# Patient Record
Sex: Female | Born: 1970 | Race: White | Hispanic: No | Marital: Married | Smoking: Former smoker
Health system: Southern US, Community
[De-identification: ages and names within clinical notes are randomized; demographics above are authoritative.]

## PROBLEM LIST (undated history)

## (undated) DIAGNOSIS — M7580 Other shoulder lesions, unspecified shoulder: Secondary | ICD-10-CM

## (undated) DIAGNOSIS — I8390 Asymptomatic varicose veins of unspecified lower extremity: Secondary | ICD-10-CM

## (undated) DIAGNOSIS — Z87442 Personal history of urinary calculi: Secondary | ICD-10-CM

## (undated) DIAGNOSIS — N200 Calculus of kidney: Secondary | ICD-10-CM

## (undated) DIAGNOSIS — M069 Rheumatoid arthritis, unspecified: Secondary | ICD-10-CM

## (undated) DIAGNOSIS — M3500 Sicca syndrome, unspecified: Secondary | ICD-10-CM

## (undated) DIAGNOSIS — M1990 Unspecified osteoarthritis, unspecified site: Secondary | ICD-10-CM

## (undated) DIAGNOSIS — N83209 Unspecified ovarian cyst, unspecified side: Secondary | ICD-10-CM

## (undated) DIAGNOSIS — M797 Fibromyalgia: Secondary | ICD-10-CM

## (undated) DIAGNOSIS — Z8719 Personal history of other diseases of the digestive system: Secondary | ICD-10-CM

## (undated) DIAGNOSIS — I7300 Raynaud's syndrome without gangrene: Secondary | ICD-10-CM

## (undated) DIAGNOSIS — K589 Irritable bowel syndrome without diarrhea: Secondary | ICD-10-CM

## (undated) HISTORY — PX: LITHOTRIPSY: SUR834

## (undated) HISTORY — PX: CHOLECYSTECTOMY: SHX55

## (undated) HISTORY — PX: ENDOMETRIAL ABLATION: SHX621

## (undated) HISTORY — PX: OTHER SURGICAL HISTORY: SHX169

---

## 1998-01-18 ENCOUNTER — Ambulatory Visit (HOSPITAL_COMMUNITY): Admission: RE | Admit: 1998-01-18 | Discharge: 1998-01-18 | Payer: Self-pay | Admitting: Obstetrics and Gynecology

## 1998-02-09 ENCOUNTER — Inpatient Hospital Stay (HOSPITAL_COMMUNITY): Admission: AD | Admit: 1998-02-09 | Discharge: 1998-02-12 | Payer: Self-pay | Admitting: Obstetrics and Gynecology

## 1998-03-31 ENCOUNTER — Other Ambulatory Visit: Admission: RE | Admit: 1998-03-31 | Discharge: 1998-03-31 | Payer: Self-pay | Admitting: Obstetrics and Gynecology

## 1999-09-09 ENCOUNTER — Emergency Department (HOSPITAL_COMMUNITY): Admission: EM | Admit: 1999-09-09 | Discharge: 1999-09-09 | Payer: Self-pay | Admitting: Emergency Medicine

## 2001-04-24 ENCOUNTER — Emergency Department (HOSPITAL_COMMUNITY): Admission: EM | Admit: 2001-04-24 | Discharge: 2001-04-24 | Payer: Self-pay | Admitting: Emergency Medicine

## 2001-04-24 ENCOUNTER — Encounter: Payer: Self-pay | Admitting: Emergency Medicine

## 2001-05-08 ENCOUNTER — Encounter: Payer: Self-pay | Admitting: Urology

## 2001-05-08 ENCOUNTER — Ambulatory Visit (HOSPITAL_COMMUNITY): Admission: RE | Admit: 2001-05-08 | Discharge: 2001-05-08 | Payer: Self-pay | Admitting: Urology

## 2001-05-15 ENCOUNTER — Emergency Department (HOSPITAL_COMMUNITY): Admission: EM | Admit: 2001-05-15 | Discharge: 2001-05-15 | Payer: Self-pay | Admitting: *Deleted

## 2001-05-20 ENCOUNTER — Ambulatory Visit (HOSPITAL_COMMUNITY): Admission: RE | Admit: 2001-05-20 | Discharge: 2001-05-20 | Payer: Self-pay | Admitting: *Deleted

## 2001-05-21 ENCOUNTER — Ambulatory Visit (HOSPITAL_COMMUNITY): Admission: RE | Admit: 2001-05-21 | Discharge: 2001-05-21 | Payer: Self-pay | Admitting: *Deleted

## 2002-03-23 ENCOUNTER — Emergency Department (HOSPITAL_COMMUNITY): Admission: EM | Admit: 2002-03-23 | Discharge: 2002-03-23 | Payer: Self-pay | Admitting: Emergency Medicine

## 2002-05-12 ENCOUNTER — Emergency Department (HOSPITAL_COMMUNITY): Admission: EM | Admit: 2002-05-12 | Discharge: 2002-05-12 | Payer: Self-pay | Admitting: Emergency Medicine

## 2002-05-31 ENCOUNTER — Emergency Department (HOSPITAL_COMMUNITY): Admission: EM | Admit: 2002-05-31 | Discharge: 2002-05-31 | Payer: Self-pay | Admitting: Emergency Medicine

## 2002-06-30 ENCOUNTER — Emergency Department (HOSPITAL_COMMUNITY): Admission: EM | Admit: 2002-06-30 | Discharge: 2002-06-30 | Payer: Self-pay | Admitting: *Deleted

## 2002-07-22 ENCOUNTER — Emergency Department (HOSPITAL_COMMUNITY): Admission: EM | Admit: 2002-07-22 | Discharge: 2002-07-22 | Payer: Self-pay | Admitting: Emergency Medicine

## 2002-08-02 ENCOUNTER — Encounter: Payer: Self-pay | Admitting: Emergency Medicine

## 2002-08-02 ENCOUNTER — Emergency Department (HOSPITAL_COMMUNITY): Admission: EM | Admit: 2002-08-02 | Discharge: 2002-08-02 | Payer: Self-pay | Admitting: Emergency Medicine

## 2003-04-09 ENCOUNTER — Encounter: Payer: Self-pay | Admitting: Emergency Medicine

## 2003-04-09 ENCOUNTER — Emergency Department (HOSPITAL_COMMUNITY): Admission: EM | Admit: 2003-04-09 | Discharge: 2003-04-10 | Payer: Self-pay | Admitting: Emergency Medicine

## 2003-07-31 LAB — CONVERTED CEMR LAB: Pap Smear: NORMAL

## 2003-08-19 ENCOUNTER — Emergency Department (HOSPITAL_COMMUNITY): Admission: EM | Admit: 2003-08-19 | Discharge: 2003-08-19 | Payer: Self-pay | Admitting: Emergency Medicine

## 2003-09-05 ENCOUNTER — Emergency Department (HOSPITAL_COMMUNITY): Admission: AD | Admit: 2003-09-05 | Discharge: 2003-09-05 | Payer: Self-pay | Admitting: Family Medicine

## 2003-11-01 ENCOUNTER — Emergency Department (HOSPITAL_COMMUNITY): Admission: AD | Admit: 2003-11-01 | Discharge: 2003-11-01 | Payer: Self-pay | Admitting: Family Medicine

## 2004-01-25 ENCOUNTER — Emergency Department (HOSPITAL_COMMUNITY): Admission: EM | Admit: 2004-01-25 | Discharge: 2004-01-25 | Payer: Self-pay | Admitting: Emergency Medicine

## 2004-02-05 ENCOUNTER — Observation Stay (HOSPITAL_COMMUNITY): Admission: RE | Admit: 2004-02-05 | Discharge: 2004-02-06 | Payer: Self-pay

## 2004-02-06 ENCOUNTER — Encounter (INDEPENDENT_AMBULATORY_CARE_PROVIDER_SITE_OTHER): Payer: Self-pay | Admitting: Specialist

## 2004-12-07 ENCOUNTER — Emergency Department (HOSPITAL_COMMUNITY): Admission: EM | Admit: 2004-12-07 | Discharge: 2004-12-07 | Payer: Self-pay | Admitting: Emergency Medicine

## 2005-04-03 ENCOUNTER — Emergency Department (HOSPITAL_COMMUNITY): Admission: EM | Admit: 2005-04-03 | Discharge: 2005-04-03 | Payer: Self-pay | Admitting: Family Medicine

## 2005-04-20 ENCOUNTER — Encounter
Admission: RE | Admit: 2005-04-20 | Discharge: 2005-07-19 | Payer: Self-pay | Admitting: Physical Medicine & Rehabilitation

## 2005-08-06 ENCOUNTER — Emergency Department (HOSPITAL_COMMUNITY): Admission: EM | Admit: 2005-08-06 | Discharge: 2005-08-07 | Payer: Self-pay | Admitting: Emergency Medicine

## 2005-09-01 ENCOUNTER — Emergency Department (HOSPITAL_COMMUNITY): Admission: EM | Admit: 2005-09-01 | Discharge: 2005-09-01 | Payer: Self-pay | Admitting: Emergency Medicine

## 2005-09-07 ENCOUNTER — Ambulatory Visit: Payer: Self-pay | Admitting: Family Medicine

## 2005-09-07 ENCOUNTER — Other Ambulatory Visit: Admission: RE | Admit: 2005-09-07 | Discharge: 2005-09-07 | Payer: Self-pay | Admitting: Obstetrics and Gynecology

## 2005-09-13 ENCOUNTER — Ambulatory Visit: Payer: Self-pay | Admitting: Family Medicine

## 2005-09-24 ENCOUNTER — Encounter (INDEPENDENT_AMBULATORY_CARE_PROVIDER_SITE_OTHER): Payer: Self-pay | Admitting: Specialist

## 2005-09-24 ENCOUNTER — Ambulatory Visit (HOSPITAL_COMMUNITY): Admission: RE | Admit: 2005-09-24 | Discharge: 2005-09-24 | Payer: Self-pay | Admitting: Obstetrics and Gynecology

## 2005-10-04 ENCOUNTER — Emergency Department (HOSPITAL_COMMUNITY): Admission: EM | Admit: 2005-10-04 | Discharge: 2005-10-04 | Payer: Self-pay | Admitting: Family Medicine

## 2005-11-27 ENCOUNTER — Emergency Department (HOSPITAL_COMMUNITY): Admission: EM | Admit: 2005-11-27 | Discharge: 2005-11-27 | Payer: Self-pay | Admitting: Family Medicine

## 2005-12-20 ENCOUNTER — Emergency Department (HOSPITAL_COMMUNITY): Admission: EM | Admit: 2005-12-20 | Discharge: 2005-12-20 | Payer: Self-pay | Admitting: Emergency Medicine

## 2005-12-30 ENCOUNTER — Emergency Department (HOSPITAL_COMMUNITY): Admission: EM | Admit: 2005-12-30 | Discharge: 2005-12-30 | Payer: Self-pay | Admitting: Emergency Medicine

## 2006-05-15 ENCOUNTER — Emergency Department (HOSPITAL_COMMUNITY): Admission: EM | Admit: 2006-05-15 | Discharge: 2006-05-15 | Payer: Self-pay | Admitting: Family Medicine

## 2006-06-18 ENCOUNTER — Emergency Department (HOSPITAL_COMMUNITY): Admission: EM | Admit: 2006-06-18 | Discharge: 2006-06-18 | Payer: Self-pay | Admitting: Emergency Medicine

## 2006-10-16 ENCOUNTER — Emergency Department (HOSPITAL_COMMUNITY): Admission: EM | Admit: 2006-10-16 | Discharge: 2006-10-16 | Payer: Self-pay | Admitting: Family Medicine

## 2007-01-03 ENCOUNTER — Emergency Department (HOSPITAL_COMMUNITY): Admission: EM | Admit: 2007-01-03 | Discharge: 2007-01-03 | Payer: Self-pay | Admitting: Emergency Medicine

## 2007-02-06 ENCOUNTER — Emergency Department (HOSPITAL_COMMUNITY): Admission: EM | Admit: 2007-02-06 | Discharge: 2007-02-06 | Payer: Self-pay | Admitting: Emergency Medicine

## 2007-04-10 DIAGNOSIS — I10 Essential (primary) hypertension: Secondary | ICD-10-CM | POA: Insufficient documentation

## 2007-04-10 DIAGNOSIS — R51 Headache: Secondary | ICD-10-CM

## 2007-04-10 DIAGNOSIS — R519 Headache, unspecified: Secondary | ICD-10-CM | POA: Insufficient documentation

## 2007-11-06 ENCOUNTER — Emergency Department (HOSPITAL_COMMUNITY): Admission: EM | Admit: 2007-11-06 | Discharge: 2007-11-06 | Payer: Self-pay | Admitting: Emergency Medicine

## 2008-04-15 ENCOUNTER — Emergency Department (HOSPITAL_BASED_OUTPATIENT_CLINIC_OR_DEPARTMENT_OTHER): Admission: EM | Admit: 2008-04-15 | Discharge: 2008-04-15 | Payer: Self-pay | Admitting: Emergency Medicine

## 2008-05-19 ENCOUNTER — Encounter: Admission: RE | Admit: 2008-05-19 | Discharge: 2008-05-19 | Payer: Self-pay | Admitting: Family Medicine

## 2008-12-21 ENCOUNTER — Ambulatory Visit: Payer: Self-pay | Admitting: Internal Medicine

## 2008-12-21 DIAGNOSIS — R69 Illness, unspecified: Secondary | ICD-10-CM

## 2008-12-21 DIAGNOSIS — G43009 Migraine without aura, not intractable, without status migrainosus: Secondary | ICD-10-CM | POA: Insufficient documentation

## 2008-12-21 DIAGNOSIS — F3289 Other specified depressive episodes: Secondary | ICD-10-CM | POA: Insufficient documentation

## 2008-12-21 DIAGNOSIS — K589 Irritable bowel syndrome without diarrhea: Secondary | ICD-10-CM

## 2008-12-21 DIAGNOSIS — F411 Generalized anxiety disorder: Secondary | ICD-10-CM | POA: Insufficient documentation

## 2008-12-21 DIAGNOSIS — Z87442 Personal history of urinary calculi: Secondary | ICD-10-CM

## 2008-12-21 DIAGNOSIS — F329 Major depressive disorder, single episode, unspecified: Secondary | ICD-10-CM

## 2008-12-21 DIAGNOSIS — M5090 Cervical disc disorder, unspecified, unspecified cervical region: Secondary | ICD-10-CM | POA: Insufficient documentation

## 2009-06-16 ENCOUNTER — Ambulatory Visit: Payer: Self-pay | Admitting: Internal Medicine

## 2009-06-16 DIAGNOSIS — G894 Chronic pain syndrome: Secondary | ICD-10-CM | POA: Insufficient documentation

## 2009-06-16 DIAGNOSIS — M25519 Pain in unspecified shoulder: Secondary | ICD-10-CM

## 2009-06-16 DIAGNOSIS — M549 Dorsalgia, unspecified: Secondary | ICD-10-CM | POA: Insufficient documentation

## 2009-06-21 ENCOUNTER — Encounter (INDEPENDENT_AMBULATORY_CARE_PROVIDER_SITE_OTHER): Payer: Self-pay | Admitting: *Deleted

## 2009-06-22 ENCOUNTER — Ambulatory Visit: Payer: Self-pay | Admitting: Internal Medicine

## 2009-07-09 ENCOUNTER — Encounter: Admission: RE | Admit: 2009-07-09 | Discharge: 2009-07-09 | Payer: Self-pay | Admitting: Internal Medicine

## 2009-07-18 ENCOUNTER — Ambulatory Visit: Payer: Self-pay | Admitting: Internal Medicine

## 2009-07-18 DIAGNOSIS — G4700 Insomnia, unspecified: Secondary | ICD-10-CM | POA: Insufficient documentation

## 2009-07-21 ENCOUNTER — Encounter: Admission: RE | Admit: 2009-07-21 | Discharge: 2009-07-21 | Payer: Self-pay | Admitting: Neurosurgery

## 2009-08-01 ENCOUNTER — Encounter
Admission: RE | Admit: 2009-08-01 | Discharge: 2009-10-30 | Payer: Self-pay | Admitting: Physical Medicine & Rehabilitation

## 2009-08-02 ENCOUNTER — Ambulatory Visit: Payer: Self-pay | Admitting: Physical Medicine & Rehabilitation

## 2009-08-02 ENCOUNTER — Telehealth: Payer: Self-pay | Admitting: Internal Medicine

## 2009-08-17 ENCOUNTER — Ambulatory Visit: Payer: Self-pay | Admitting: Internal Medicine

## 2009-09-05 ENCOUNTER — Ambulatory Visit: Payer: Self-pay | Admitting: Physical Medicine & Rehabilitation

## 2009-09-15 ENCOUNTER — Encounter: Payer: Self-pay | Admitting: Internal Medicine

## 2009-09-26 ENCOUNTER — Ambulatory Visit: Payer: Self-pay | Admitting: Internal Medicine

## 2009-10-06 ENCOUNTER — Ambulatory Visit: Payer: Self-pay | Admitting: Physical Medicine & Rehabilitation

## 2009-10-17 ENCOUNTER — Telehealth: Payer: Self-pay | Admitting: Internal Medicine

## 2009-10-18 ENCOUNTER — Encounter: Payer: Self-pay | Admitting: Internal Medicine

## 2009-10-28 ENCOUNTER — Ambulatory Visit: Payer: Self-pay | Admitting: Internal Medicine

## 2009-10-28 DIAGNOSIS — G2581 Restless legs syndrome: Secondary | ICD-10-CM | POA: Insufficient documentation

## 2009-11-01 ENCOUNTER — Encounter
Admission: RE | Admit: 2009-11-01 | Discharge: 2010-01-30 | Payer: Self-pay | Admitting: Physical Medicine & Rehabilitation

## 2009-11-07 ENCOUNTER — Ambulatory Visit: Payer: Self-pay | Admitting: Physical Medicine & Rehabilitation

## 2009-11-07 ENCOUNTER — Encounter: Payer: Self-pay | Admitting: Internal Medicine

## 2009-11-22 ENCOUNTER — Ambulatory Visit: Payer: Self-pay | Admitting: Internal Medicine

## 2009-12-15 ENCOUNTER — Telehealth: Payer: Self-pay | Admitting: Internal Medicine

## 2009-12-15 ENCOUNTER — Encounter: Payer: Self-pay | Admitting: Internal Medicine

## 2009-12-15 ENCOUNTER — Ambulatory Visit: Payer: Self-pay | Admitting: Physical Medicine & Rehabilitation

## 2010-01-12 ENCOUNTER — Ambulatory Visit: Payer: Self-pay | Admitting: Internal Medicine

## 2010-01-12 DIAGNOSIS — IMO0001 Reserved for inherently not codable concepts without codable children: Secondary | ICD-10-CM

## 2010-01-12 DIAGNOSIS — IMO0002 Reserved for concepts with insufficient information to code with codable children: Secondary | ICD-10-CM

## 2010-01-12 DIAGNOSIS — R300 Dysuria: Secondary | ICD-10-CM

## 2010-01-12 DIAGNOSIS — R799 Abnormal finding of blood chemistry, unspecified: Secondary | ICD-10-CM

## 2010-01-12 LAB — CONVERTED CEMR LAB
ANA Titer 1: NEGATIVE
Ketones, ur: NEGATIVE mg/dL
Nitrite: NEGATIVE
Specific Gravity, Urine: 1.015 (ref 1.000–1.030)
Urine Glucose: NEGATIVE mg/dL
Urobilinogen, UA: 0.2 (ref 0.0–1.0)
pH: 5.5 (ref 5.0–8.0)

## 2010-01-13 ENCOUNTER — Encounter: Payer: Self-pay | Admitting: Internal Medicine

## 2010-01-13 LAB — CONVERTED CEMR LAB
ALT: 11 units/L (ref 0–35)
Alkaline Phosphatase: 40 units/L (ref 39–117)
BUN: 11 mg/dL (ref 6–23)
Basophils Absolute: 0 10*3/uL (ref 0.0–0.1)
Bilirubin, Direct: 0.1 mg/dL (ref 0.0–0.3)
Cholesterol: 171 mg/dL (ref 0–200)
Creatinine, Ser: 0.5 mg/dL (ref 0.4–1.2)
HDL: 55.5 mg/dL (ref >39.00)
LDL Cholesterol: 87 mg/dL (ref 0–99)
MCHC: 34.7 g/dL (ref 30.0–36.0)
MCV: 96.2 fL (ref 78.0–100.0)
Monocytes Relative: 4.2 % (ref 3.0–12.0)
RDW: 12.4 % (ref 11.5–14.6)
Sed Rate: 4 mm/hr (ref 0–22)
Sodium: 142 meq/L (ref 135–145)
Total Protein: 6.7 g/dL (ref 6.0–8.3)
WBC: 5.6 10*3/uL (ref 4.5–10.5)

## 2010-01-16 ENCOUNTER — Encounter: Payer: Self-pay | Admitting: Internal Medicine

## 2010-01-16 ENCOUNTER — Ambulatory Visit: Payer: Self-pay | Admitting: Physical Medicine & Rehabilitation

## 2010-01-18 ENCOUNTER — Encounter: Payer: Self-pay | Admitting: Internal Medicine

## 2010-01-18 ENCOUNTER — Ambulatory Visit: Payer: Self-pay | Admitting: Pain Medicine

## 2010-01-18 ENCOUNTER — Encounter: Admission: RE | Admit: 2010-01-18 | Discharge: 2010-01-18 | Payer: Self-pay | Admitting: Family Medicine

## 2010-01-19 ENCOUNTER — Ambulatory Visit (HOSPITAL_COMMUNITY): Admission: RE | Admit: 2010-01-19 | Discharge: 2010-01-19 | Payer: Self-pay | Admitting: Internal Medicine

## 2010-02-08 ENCOUNTER — Encounter: Payer: Self-pay | Admitting: Internal Medicine

## 2010-02-08 ENCOUNTER — Telehealth: Payer: Self-pay | Admitting: Internal Medicine

## 2010-02-14 ENCOUNTER — Encounter
Admission: RE | Admit: 2010-02-14 | Discharge: 2010-02-14 | Payer: Self-pay | Admitting: Physical Medicine & Rehabilitation

## 2010-02-21 ENCOUNTER — Telehealth (INDEPENDENT_AMBULATORY_CARE_PROVIDER_SITE_OTHER): Payer: Self-pay | Admitting: *Deleted

## 2010-03-06 ENCOUNTER — Encounter: Payer: Self-pay | Admitting: Internal Medicine

## 2010-03-06 ENCOUNTER — Telehealth: Payer: Self-pay | Admitting: Internal Medicine

## 2010-04-11 ENCOUNTER — Ambulatory Visit: Payer: Self-pay | Admitting: Internal Medicine

## 2010-04-12 ENCOUNTER — Ambulatory Visit: Payer: Self-pay | Admitting: Internal Medicine

## 2010-04-29 ENCOUNTER — Encounter: Payer: Self-pay | Admitting: Internal Medicine

## 2010-05-01 ENCOUNTER — Ambulatory Visit: Payer: Self-pay | Admitting: Pain Medicine

## 2010-05-17 ENCOUNTER — Telehealth: Payer: Self-pay | Admitting: Internal Medicine

## 2010-05-30 ENCOUNTER — Encounter: Payer: Self-pay | Admitting: Internal Medicine

## 2010-08-19 ENCOUNTER — Encounter: Payer: Self-pay | Admitting: Family Medicine

## 2010-08-20 ENCOUNTER — Encounter: Payer: Self-pay | Admitting: Neurosurgery

## 2010-08-20 ENCOUNTER — Encounter: Payer: Self-pay | Admitting: Internal Medicine

## 2010-08-29 NOTE — Op Note (Signed)
Summary: Claudette Laws MD  Claudette Laws MD   Imported By: Sherian Rein 06/01/2010 13:18:52  _____________________________________________________________________  External Attachment:    Type:   Image     Comment:   External Document

## 2010-08-29 NOTE — Consult Note (Signed)
Summary: Stacey Drain MD  Stacey Drain MD   Imported By: Sherian Rein 02/20/2010 11:38:36  _____________________________________________________________________  External Attachment:    Type:   Image     Comment:   External Document

## 2010-08-29 NOTE — Consult Note (Signed)
Summary: [facility]  [facility]   Imported By: Sherian Rein 09/22/2009 09:54:48  _____________________________________________________________________  External Attachment:    Type:   Image     Comment:   External Document

## 2010-08-29 NOTE — Assessment & Plan Note (Signed)
Summary: 1 MTH FU   STC   Vital Signs:  Patient profile:   40 year old female Height:      63 inches (160.02 cm) Weight:      133.13 pounds (60.51 kg) O2 Sat:      98 % on Room air Temp:     97.4 degrees F (36.33 degrees C) oral Pulse rate:   81 / minute BP sitting:   148 / 94  (left arm) Cuff size:   regular  Vitals Entered By: Josph Macho CMA (August 17, 2009 4:17 PM)  O2 Flow:  Room air CC: 1 month follow up/ wants referral to pain management- Dr Oneal Grout pt needs refill of oxycodone/ CF   CC:  1 month follow up/ wants referral to pain management- Dr Oneal Grout pt needs refill of oxycodone/ CF.  History of Present Illness: saw dr Wynn Banker once and received steroid injection for right rotater cuff tendonitis, confirmed by MRI;  she was dissastified overall b/c he elected to try wean her off narcotic; so she requests change to  dr Oneal Grout [phone]; is taking the losartan but does not feel it is working at all;   also with marked anxiety today; Pt denies CP, sob, doe, wheezing, orthopnea, pnd, worsening LE edema, palps, dizziness or syncope   Pt denies new neuro symptoms such as headache, facial or extremity weakness   Problems Prior to Update: 1)  Insomnia-sleep Disorder-unspec  (ICD-780.52) 2)  Chronic Pain Syndrome  (ICD-338.4) 3)  Back Pain, Chronic  (ICD-724.5) 4)  Shoulder Pain, Right  (ICD-719.41) 5)  Other Ill-defined Conditions  (ICD-799.89) 6)  Preventive Health Care  (ICD-V70.0) 7)  Cervical Disc Disorder  (ICD-722.91) 8)  Ibs  (ICD-564.1) 9)  Depression  (ICD-311) 10)  Anxiety  (ICD-300.00) 11)  Nephrolithiasis, Hx of  (ICD-V13.01) 12)  Common Migraine  (ICD-346.10) 13)  Family History Diabetes 1st Degree Relative  (ICD-V18.0) 14)  Headache  (ICD-784.0) 15)  Hypertension  (ICD-401.9)  Medications Prior to Update: 1)  Oxycodone Hcl 15 Mg Tabs (Oxycodone Hcl) .Marland Kitchen.. 1 By Mouth Q 4 Hrs As Needed 2)  Zolpidem Tartrate 10 Mg Tabs (Zolpidem Tartrate) .Marland Kitchen.. 1po At  Bedtime As Needed 3)  Gabapentin 300 Mg Caps (Gabapentin) .Marland Kitchen.. 1 By Mouth Three Times A Day 4)  Bupropion Hcl 150 Mg Xr24h-Tab (Bupropion Hcl) .Marland Kitchen.. 1 By Mouth Once Daily 5)  Losartan Potassium 50 Mg Tabs (Losartan Potassium) .Marland Kitchen.. 1 By Mouth Once Daily  Current Medications (verified): 1)  Percocet 10-325 Mg Tabs (Oxycodone-Acetaminophen) .Marland Kitchen.. 1po Q 4 Hrs As Needed Pain  - To Fill Aug 18, 2009 2)  Zolpidem Tartrate 10 Mg Tabs (Zolpidem Tartrate) .Marland Kitchen.. 1po At Bedtime As Needed 3)  Gabapentin 300 Mg Caps (Gabapentin) .Marland Kitchen.. 1 By Mouth Three Times A Day 4)  Lexapro 10 Mg Tabs (Escitalopram Oxalate) .Marland Kitchen.. 1po Once Daily 5)  Losartan Potassium 100 Mg Tabs (Losartan Potassium) .Marland Kitchen.. 1 By Mouth Once Daily  Allergies (verified): No Known Drug Allergies  Past History:  Past Medical History: Last updated: 12/21/2008 Hypertension Migraines Arthritis Nephrolithiasis, hx of Anxiety Depression IBS c-spine disc disease lumbar disc disease  Past Surgical History: Last updated: 12/21/2008 Cholecystectomy Lithotripsy 2002  Caesarean section x 2 s/p endometrial ablation for endometriosis 2005/amenorheic  Social History: Last updated: 12/21/2008 Occupation: self -employed - cleaning businss since 2002 Married Never Smoked Alcohol use-no Drug use-no Regular exercise-yes 2 daughters  Risk Factors: Exercise: yes (04/10/2007)  Risk Factors: Smoking Status: never (04/10/2007)  Review of  Systems       all otherwise negative per pt -  Physical Exam  General:  alert and well-developed.   Head:  normocephalic and atraumatic.   Eyes:  vision grossly intact, pupils equal, and pupils round.   Ears:  R ear normal and L ear normal.   Nose:  no external deformity and no nasal discharge.   Mouth:  no gingival abnormalities and pharynx pink and moist.   Neck:  supple and no masses.   Lungs:  normal respiratory effort and normal breath sounds.   Heart:  normal rate and regular rhythm.   Msk:   right shoulder with severe pain wtih abduciton and can only do so to 90 degrees only Extremities:  no edema, no erythema  Neurologic:  cranial nerves II-XII intact, strength normal in upper extremities, sensation intact to light touch, and DTRs symmetrical and normal. except for 4/5 RUE grip strengtha and seemingly diffuse RUE weakness prox and distal; also has decreased sens to LT to right c4 and c5 dermatomes;  LE's grossly normal though not tested in detail;  ambulates well   Impression & Recommendations:  Problem # 1:  CHRONIC PAIN SYNDROME (ICD-338.4)  to cont meds, refer per request as above, refills per EMR  Orders: Pain Clinic Referral (Pain)  Problem # 2:  ANXIETY (ICD-300.00)  Her updated medication list for this problem includes:    Lexapro 10 Mg Tabs (Escitalopram oxalate) .Marland Kitchen... 1po once daily treat as above, f/u any worsening signs or symptoms   Problem # 3:  SHOULDER PAIN, RIGHT (ICD-719.41)  Her updated medication list for this problem includes:    Percocet 10-325 Mg Tabs (Oxycodone-acetaminophen) .Marland Kitchen... 1po q 4 hrs as needed pain  - to fill Aug 18, 2009 improved somewhat , to refer ortho  Orders: Orthopedic Surgeon Referral (Ortho Surgeon)  Complete Medication List: 1)  Percocet 10-325 Mg Tabs (Oxycodone-acetaminophen) .Marland Kitchen.. 1po q 4 hrs as needed pain  - to fill Aug 18, 2009 2)  Zolpidem Tartrate 10 Mg Tabs (Zolpidem tartrate) .Marland Kitchen.. 1po at bedtime as needed 3)  Gabapentin 300 Mg Caps (Gabapentin) .Marland Kitchen.. 1 by mouth three times a day 4)  Lexapro 10 Mg Tabs (Escitalopram oxalate) .Marland Kitchen.. 1po once daily 5)  Losartan Potassium 100 Mg Tabs (Losartan potassium) .Marland Kitchen.. 1 by mouth once daily  Patient Instructions: 1)  Please take all new medications as prescribed; start the lexapro at HALF pill for the first 3 days  (if you have slight nausea, it goes away in the first few days) 2)  Continue all previous medications as before this visit , except increase the losartan to 100 mg  per day 3)  You will be contacted about the referral(s) to: orthopedic, and the Pain clinic 4)  Please schedule a follow-up appointment 1 month Prescriptions: LEXAPRO 10 MG TABS (ESCITALOPRAM OXALATE) 1po once daily  #30 x 11   Entered and Authorized by:   Corwin Levins MD   Signed by:   Corwin Levins MD on 08/17/2009   Method used:   Print then Give to Patient   RxID:   [id number] PERCOCET 10-325 MG TABS (OXYCODONE-ACETAMINOPHEN) 1po q 4 hrs as needed pain  - to fill Aug 18, 2009  #180 x 0   Entered and Authorized by:   Corwin Levins MD   Signed by:   Corwin Levins MD on 08/17/2009   Method used:   Print then Give to Patient   RxID:  [id number] LOSARTAN POTASSIUM 100 MG TABS (LOSARTAN POTASSIUM) 1 by mouth once daily  #30 x 11   Entered and Authorized by:   Corwin Levins MD   Signed by:   Corwin Levins MD on 08/17/2009   Method used:   Print then Give to Patient   RxID:   [id number]

## 2010-08-29 NOTE — Assessment & Plan Note (Signed)
Summary: FU/NWS  #   Vital Signs:  Patient profile:   40 year old female Height:      62 inches Weight:      136.75 pounds BMI:     25.10 O2 Sat:      99 % on Room air Temp:     98.1 degrees F oral Pulse rate:   76 / minute BP sitting:   158 / 90  (left arm) Cuff size:   regular  Vitals Entered ByZella Ball Ewing (October 28, 2009 4:01 PM)  O2 Flow:  Room air CC: followup, refills/RE   CC:  followup and refills/RE.  History of Present Illness: her to f/u, wants to try to come off percocet if possible but tried to stop cold and has had some withdrawal symptoms ; c/o thoughts racing during the day and did just started the lexapro due to delay with the approval process with insurance;  the [place] helps to get to sleep but not stay asleep and seems to lead to increased fatigue during the day;  has mild worsening RLS symptoms as well recently that are likely a factor at night as well;  has been seeing pain clinic and suggested help with weaning "but I've been down that road before"  and does not want to try suboxone or other due to side effects;  wants to avoid this and stay able to function in providing and caring for the family;  no worsening depressive symptoms recently, no suicidal ideation or panic;  BP recently has been < 140/90 recently;  Pt denies CP, sob, doe, wheezing, orthopnea, pnd, worsening LE edema, palps, dizziness or syncope   Pt denies new neuro symptoms such as headache, facial or extremity weakness     Problems Prior to Update: 1)  Restless Leg Syndrome  (ICD-333.94) 2)  Insomnia-sleep Disorder-unspec  (ICD-780.52) 3)  Chronic Pain Syndrome  (ICD-338.4) 4)  Back Pain, Chronic  (ICD-724.5) 5)  Shoulder Pain, Right  (ICD-719.41) 6)  Other Ill-defined Conditions  (ICD-799.89) 7)  Preventive Health Care  (ICD-V70.0) 8)  Cervical Disc Disorder  (ICD-722.91) 9)  Ibs  (ICD-564.1) 10)  Depression  (ICD-311) 11)  Anxiety  (ICD-300.00) 12)  Nephrolithiasis, Hx of   (ICD-V13.01) 13)  Common Migraine  (ICD-346.10) 14)  Family History Diabetes 1st Degree Relative  (ICD-V18.0) 15)  Headache  (ICD-784.0) 16)  Hypertension  (ICD-401.9)  Medications Prior to Update: 1)  Percocet 10-325 Mg Tabs (Oxycodone-Acetaminophen) .Marland Kitchen.. 1po Q 4 Hrs As Needed Pain  - To Fill Aug 18, 2009 2)  Zolpidem Tartrate 10 Mg Tabs (Zolpidem Tartrate) .Marland Kitchen.. 1po At Bedtime As Needed 3)  Gabapentin 300 Mg Caps (Gabapentin) .Marland Kitchen.. 1 By Mouth Three Times A Day 4)  Lexapro 10 Mg Tabs (Escitalopram Oxalate) .Marland Kitchen.. 1po Once Daily 5)  Losartan Potassium 100 Mg Tabs (Losartan Potassium) .Marland Kitchen.. 1 By Mouth Once Daily  Current Medications (verified): 1)  Percocet 7.5-325 Mg Tabs (Oxycodone-Acetaminophen) .... Generic - 1 By Mouth Q 6 Hrs As Needed Pain 2)  Clonazepam 1 Mg Tbdp (Clonazepam) .Marland Kitchen.. 1po At Bedtime As Needed 3)  Gabapentin 300 Mg Caps (Gabapentin) .Marland Kitchen.. 1 By Mouth Three Times A Day 4)  Lexapro 10 Mg Tabs (Escitalopram Oxalate) .Marland Kitchen.. 1po Once Daily 5)  Losartan Potassium 100 Mg Tabs (Losartan Potassium) .Marland Kitchen.. 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   Head:  normocephalic and atraumatic.   Eyes:  vision grossly intact, pupils  equal, and pupils round.   Ears:  R ear normal and L ear normal.   Nose:  no external deformity and no nasal discharge.   Mouth:  no gingival abnormalities and pharynx pink and moist.   Neck:  supple and no masses.   Lungs:  normal respiratory effort and normal breath sounds.   Heart:  normal rate and regular rhythm.   Msk:  no joint tenderness and no joint swelling.   Extremities:  no edema, no erythema  Neurologic:  cranial nerves II-XII intact and strength normal in all extremities.   Psych:  not depressed appearing and slightly anxious.     Impression & Recommendations:  Problem # 1:  INSOMNIA-SLEEP DISORDER-UNSPEC (ICD-780.52)  with RLS symptoms- to change to klonopin 1 mg at bedtime   Problem # 2:  CHRONIC PAIN SYNDROME (ICD-338.4) to try to wean off percocet by startin the 7.5 qid for one month, 5 /325 qid for one month, then 5/325 - 1/2 qid for one month, then two times a day for one month, then off;  also to re-start the gabapentin ;  start with one mo rx percocet as above  Problem # 3:  RESTLESS LEG SYNDROME (ICD-333.94) as above  Problem # 4:  ANXIETY (ICD-300.00)  Her updated medication list for this problem includes:    Clonazepam 1 Mg Tbdp (Clonazepam) .Marland Kitchen... 1po at bedtime as needed    Lexapro 10 Mg Tabs (Escitalopram oxalate) .Marland Kitchen... 1po once daily treat as above, f/u any worsening signs or symptoms   Problem # 5:  HYPERTENSION (ICD-401.9)  Her updated medication list for this problem includes:    Losartan Potassium 100 Mg Tabs (Losartan potassium) .Marland Kitchen... 1 by mouth once daily  BP today: 158/90 Prior BP: 148/94 (08/17/2009) mild elev today, likely situational, ok to follow, continue same treatment , follow closely at home as well  Complete Medication List: 1)  Percocet 7.5-325 Mg Tabs (Oxycodone-acetaminophen) .... Generic - 1 by mouth q 6 hrs as needed pain 2)  Clonazepam 1 Mg Tbdp (Clonazepam) .Marland Kitchen.. 1po at bedtime as needed 3)  Gabapentin 300 Mg Caps  (Gabapentin) .Marland Kitchen.. 1 by mouth three times a day 4)  Lexapro 10 Mg Tabs (Escitalopram oxalate) .Marland Kitchen.. 1po once daily 5)  Losartan Potassium 100 Mg Tabs (Losartan potassium) .Marland Kitchen.. 1 by mouth once daily  Patient Instructions: 1)  Please take all new medications as prescribed - the generic percocet 7.5/325 four times per day for one month 2)  start the gabapentin at one pill at night for 3 nights, then twice per day for 3 days, then threee times per day after that 3)  start the clonazepam at night for the sleep and restless leg 4)  Continue all previous medications as before this visit , including the lexapro 5)  Please schedule a follow-up appointment in 1 month. Prescriptions: PERCOCET 7.5-325 MG TABS (OXYCODONE-ACETAMINOPHEN)  generic - 1 by mouth q 6 hrs as needed pain  #120 x 0   Entered and Authorized by:   Corwin Levins MD   Signed by:   Corwin Levins MD on 10/28/2009   Method used:   Print then Give to Patient   RxID:   [id number] GABAPENTIN 300 MG CAPS (GABAPENTIN) 1 by mouth three times a day  #90 x 5   Entered and Authorized by:   Corwin Levins MD   Signed by:   Corwin Levins MD on 10/28/2009   Method used:   Print then Give to Patient   RxID:   [id number] CLONAZEPAM 1 MG TBDP (CLONAZEPAM) 1po at bedtime as needed  #30 x 5   Entered and Authorized by:   Corwin Levins MD   Signed by:   Corwin Levins MD on 10/28/2009   Method used:   Print then Give to Patient   RxID:   [id number]

## 2010-08-29 NOTE — Assessment & Plan Note (Signed)
Summary: PHYSICAL- [facility]   Vital Signs:  Patient profile:   40 year old female Height:      62 inches Weight:      136 pounds BMI:     24.96 O2 Sat:      99 % on Room air Temp:     98.2 degrees F oral Pulse rate:   111 / minute BP sitting:   156 / 100  (left arm) Cuff size:   regular  Vitals Entered ByZella Ball Ewing (January 12, 2010 10:15 AM)  O2 Flow:  Room air  CC: Adult Physical/RE   CC:  Adult Physical/RE.  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    Problems Prior to Update: 1)  Rheumatoid Factor, Positive  (ICD-790.99) 2)  Fibromyalgia  (ICD-729.1) 3)  Dysuria  (ICD-788.1) 4)  Lumbar Radiculopathy, Left  (ICD-724.4) 5)  Restless Leg Syndrome  (ICD-333.94) 6)  Insomnia-sleep Disorder-unspec  (ICD-780.52) 7)  Chronic Pain Syndrome  (ICD-338.4) 8)  Back Pain, Chronic  (ICD-724.5) 9)  Shoulder Pain, Right  (ICD-719.41) 10)  Other Ill-defined Conditions  (ICD-799.89) 11)  Preventive Health Care  (ICD-V70.0) 12)  Cervical Disc Disorder  (ICD-722.91) 13)  Ibs  (ICD-564.1) 14)  Depression  (ICD-311) 15)  Anxiety  (ICD-300.00) 16)  Nephrolithiasis, Hx of  (ICD-V13.01) 17)  Common Migraine  (ICD-346.10) 18)  Family History Diabetes 1st Degree Relative  (ICD-V18.0) 19)  Headache  (ICD-784.0) 20)  Hypertension   (ICD-401.9)  Medications Prior to Update: 1)  Percocet 7.5-325 Mg Tabs (Oxycodone-Acetaminophen) .... Generic - 1 By Mouth Q 4 Hrs As Needed 2)  Clonazepam 1 Mg Tbdp (Clonazepam) .Marland Kitchen.. 1po At Bedtime As Needed 3)  Gabapentin 300 Mg Caps (Gabapentin) .Marland Kitchen.. 1 By Mouth Three Times A Day 4)  Lexapro 10 Mg Tabs (Escitalopram Oxalate) .Marland Kitchen.. 1po Once Daily 5)  Losartan Potassium 100 Mg Tabs (Losartan Potassium) .Marland Kitchen.. 1 By Mouth Once Daily  Current Medications (verified): 1)  Percocet 10-325 Mg Tabs (Oxycodone-Acetaminophen) .Marland Kitchen.. 1po Q 6 Hrs As Needed - To Fill July 16 2)  Clonazepam 1 Mg Tbdp (Clonazepam) .Marland Kitchen.. 1po At Bedtime As Needed 3)  Cymbalta 60 Mg Cpep (Duloxetine Hcl) .Marland Kitchen.. 1po Once Daily 4)  Losartan Potassium-Hctz 100-25 Mg Tabs (Losartan Potassium-Hctz) .Marland Kitchen.. 1po Once Daily 5)  Cephalexin 500 Mg Caps (Cephalexin) .Marland Kitchen.. 1 By Mouth Three Times A Day  Allergies (verified): No Known Drug Allergies  Past History:  Past Surgical History: Last updated: 12/21/2008 Cholecystectomy Lithotripsy 2002  Caesarean section x 2 s/p endometrial ablation for endometriosis 2005/amenorheic  Family History: Last updated: 12/21/2008 Family History of Arthritis - father with DJD Family History High cholesterol sister with HTN father with HTN m-grandmother with HTN, and MI in her 38's, DM mother with MI at 21  yo.,  IBS, borderline DM great-aunt with DM  Social History: Last updated: 12/21/2008 Occupation: self -employed - cleaning businss since 2002 Married Never Smoked Alcohol use-no Drug use-no Regular exercise-yes 2 daughters  Risk Factors: Exercise: yes (04/10/2007)  Risk Factors: Smoking Status: never (04/10/2007)  Past Medical History: Hypertension Migraines Arthritis Nephrolithiasis, hx of Anxiety Depression IBS c-spine disc disease lumbar disc disease RLS FMS chronic pain syndrome  Review of Systems  The patient denies anorexia, fever, vision loss, decreased hearing,  hoarseness, chest pain, syncope, dyspnea on exertion, peripheral edema, prolonged cough, headaches, hemoptysis, abdominal pain, melena, hematochezia, severe indigestion/heartburn, hematuria, suspicious skin lesions, transient blindness, difficulty walking, unusual weight change, abnormal bleeding, enlarged lymph nodes, and angioedema.         all otherwise negative per pt -  except mild dysuria for 2 days  Physical Exam  General:  alert and well-developed.   Head:  normocephalic and atraumatic.   Eyes:  vision grossly intact, pupils equal, and pupils round.   Ears:  R ear normal and L ear normal.   Nose:  no external deformity and no nasal discharge.   Mouth:  no gingival abnormalities and pharynx pink and moist.   Neck:  supple and no masses.   Lungs:  normal respiratory effort and normal breath sounds.   Heart:  normal rate and regular rhythm.   Abdomen:  soft, non-tender, and normal bowel sounds.  except for mild mid low abd tender without guarding or rebound Msk:  no joint tenderness and no joint swelling.  , but has diffuse tender to the upper and lower back  Extremities:  no edema, no erythema  Neurologic:  cranial nerves II-XII intact, strength normal in all extremities, gait normal, and DTRs symmetrical and normal.   Skin:  color normal and no rashes.   Psych:  not depressed appearing and moderately anxious.     Impression & Recommendations:  Problem # 1:  Preventive Health Care (ICD-V70.0)  Overall doing well, age appropriate education and counseling updated and referral for appropriate preventive services done unless declined, immunizations up to date or declined, diet counseling done if overweight, urged to quit smoking if smokes , most recent labs reviewed and current ordered if appropriate, ecg reviewed or declined (interpretation per ECG scanned in the EMR if done); information regarding Medicare Prevention requirements given if appropriate; speciality referrals updated as  appropriate   Orders: TLB-BMP (Basic Metabolic Panel-BMET) (80048-METABOL) TLB-CBC Platelet - w/Differential (85025-CBCD) TLB-Hepatic/Liver Function Pnl (80076-HEPATIC) TLB-Lipid Panel (80061-LIPID) TLB-TSH (Thyroid Stimulating Hormone) (84443-TSH)  Problem # 2:  HYPERTENSION (ICD-401.9)  Her updated medication list for this problem includes:    Losartan Potassium-hctz 100-25 Mg Tabs (Losartan potassium-hctz) .Marland Kitchen... 1po once daily change to losartan hct for better control  BP today: 156/100 Prior BP: 156/100 (11/22/2009)  Problem # 3:  LUMBAR RADICULOPATHY, LEFT (ICD-724.4)  Her updated medication list for this problem includes:    Percocet 10-325 Mg Tabs (Oxycodone-acetaminophen) .Marland Kitchen... 1po q 6 hrs as needed - to fill july 16 for MRI, and pt requets polyarthralagia labs as well - will check MRI,  to f/u pain clinic as planned  Orders: Radiology Referral (Radiology)  Problem # 4:  DYSURIA (ICD-788.1)  Orders: T-Culture, Urine (81191-47829) TLB-Udip w/ Micro (81001-URINE)  Her updated medication list for this problem includes:    Cephalexin 500 Mg Caps (Cephalexin) .Marland Kitchen... 1 by mouth three times a day prob UTI  - for treat as above, f/u any worsening signs or symptoms , check  urine studies  Problem # 5:  FIBROMYALGIA (ICD-729.1)  Her updated medication list for this problem includes:    Percocet 10-325 Mg Tabs (Oxycodone-acetaminophen) .Marland Kitchen... 1po q 6 hrs as needed - to fill july 16 most likely - for rheum labs, and change lexapro to cymbalta   Orders: T-Syphilis Test (RPR) (46962-95284) T-ANA Titer and Pattern (13244-01027) TLB-Sedimentation Rate (ESR) (85652-ESR) TLB-Rheumatoid Factor (RA) (25366-YQ)  Complete Medication List: 1)  Percocet 10-325 Mg Tabs (Oxycodone-acetaminophen) .Marland Kitchen.. 1po q 6 hrs as needed - to fill july 16 2)  Clonazepam 1 Mg Tbdp (Clonazepam) .Marland Kitchen.. 1po at bedtime as needed 3)  Cymbalta 60 Mg Cpep (Duloxetine hcl) .Marland Kitchen.. 1po once daily 4)  Losartan  Potassium-hctz 100-25 Mg Tabs (Losartan potassium-hctz) .Marland Kitchen.. 1po once daily 5)  Cephalexin 500 Mg Caps (Cephalexin) .Marland Kitchen.. 1 by mouth three times a day  Patient Instructions: 1)  please call for baseline mammogram - consider [facility] on [address]  2)  please call your GYN for yearly pap smear 3)  Please go to the Lab in the basement for your blood and/or urine tests today, including the urine culture 4)  You will be contacted about the referral(s) to: MRI for the lumbar 5)  Please take all new medications as prescribed - the antibiotic, blood pressure medicine and the stronger pain medicine, and the cymbalta samples (start with 30 mg per day for 1 wk, then 60 mg per day after that) 6)  All medications were sent to the pharmacy except for the percocets 7)  Remember, the 90 day prescriptions may be cheaper at the mail-in pharmacy 8)  Continue all previous medications as before this visit , except stop the losartan and lexapro and gabapentin 9)  Please schedule a follow-up appointment in 1 year or sooner if needed 10)  Check your Blood Pressure regularly. If it is above 140/90: you should make an appointment. Prescriptions: LOSARTAN POTASSIUM-HCTZ 100-25 MG TABS (LOSARTAN POTASSIUM-HCTZ) 1po once daily  #30 x 11   Entered and Authorized by:   Corwin Levins MD   Signed by:   Corwin Levins MD on 01/12/2010   Method used:   Electronically to        [facility]* (retail)       [address]       [place], [place]  [address]       Ph: [phone] or [phone]       Fax: [phone]   RxID:   [id number] CYMBALTA 60 MG CPEP (DULOXETINE HCL) 1po once daily  #30 x 11   Entered and Authorized by:   Corwin Levins MD   Signed by:   Corwin Levins MD on 01/12/2010   Method used:   Electronically to        [facility]* (retail)       [address] [place], [place]  [address]       Ph: [phone] or [phone]       Fax: [phone]   RxID:    [id number] CEPHALEXIN 500 MG CAPS (CEPHALEXIN) 1 by mouth three times a day  #30 x 0   Entered and Authorized by:   Corwin Levins MD   Signed by:   Corwin Levins MD on 01/12/2010   Method used:   Electronically to        [facility]* (retail)       [address] [place]  [place], [place]  [address]       Ph: [phone] or [phone]       Fax: [phone]   RxID:   [id number] LOSARTAN POTASSIUM-HCTZ 100-25 MG TABS (LOSARTAN POTASSIUM-HCTZ) 1po once daily  #90 x 3   Entered and Authorized by:   Corwin Levins MD   Signed by:   Corwin Levins MD on 01/12/2010   Method used:   Print then Give to Patient   RxID:   [id number] CYMBALTA 60 MG CPEP (DULOXETINE HCL) 1po once daily  #90 x 3   Entered and Authorized by:   Corwin Levins MD   Signed by:   Corwin Levins MD on 01/12/2010   Method used:   Print then Give to Patient   RxID:   [id number] CYMBALTA 60 MG CPEP (DULOXETINE HCL) 1po once daily  #30 x 11   Entered and Authorized by:   Corwin Levins MD   Signed by:   Corwin Levins MD on 01/12/2010   Method used:   Print then Give to Patient   RxID:   [id number] PERCOCET 10-325 MG TABS (OXYCODONE-ACETAMINOPHEN) 1po q 6 hrs as needed - to fill july 16  #120 x 0   Entered and Authorized by:   Corwin Levins MD   Signed by:   Corwin Levins MD on 01/12/2010   Method used:   Print then Give to Patient   RxID:   [id number] PERCOCET 10-325 MG TABS (OXYCODONE-ACETAMINOPHEN) 1po q 6 hrs as needed  #120 x 0   Entered and Authorized by:   Corwin Levins MD   Signed by:   Corwin Levins MD on 01/12/2010   Method used:   Print then Give to Patient   RxID:   [id number] LOSARTAN POTASSIUM-HCTZ 100-25 MG TABS (LOSARTAN POTASSIUM-HCTZ) 1po once daily  #90 x 3   Entered and Authorized by:   Corwin Levins MD   Signed by:   Corwin Levins MD on 01/12/2010   Method used:   Electronically to        [facility]* (retail)       [address]       [place], [place]   [address]       Ph: [phone] or [phone]       Fax: [phone]   RxID:   [id number]

## 2010-08-29 NOTE — Assessment & Plan Note (Signed)
Summary: 1 MO ROV /NWS   Vital Signs:  Patient profile:   41 year old female Height:      62 inches Weight:      139.25 pounds BMI:     25.56 O2 Sat:      99 % on Room air Temp:     97.7 degrees F oral Pulse rate:   105 / minute BP sitting:   156 / 100  (left arm) Cuff size:   regular  Vitals Entered ByZella Ball Ewing (November 22, 2009 4:01 PM)  O2 Flow:  Room air CC: 1 Month ROV/RE   CC:  1 Month ROV/RE.  History of Present Illness: here to f/u;  did not care for the treatment with her pain clinic and presents thinking I will take over ;  has ongoin pain to lower back, somewhat worse to the left back in the past wk, with some radaition to the left lower leg with fall and balance issue - has fallen twice in the past 2 wks.  At one point had swelling to the left ankle as well with twisting with the first fall but this has improved.   Also states she has not increased the losartan as requested at last visit - BP remains elevated.  Pt denies CP, sob, doe, wheezing, orthopnea, pnd, worsening LE edema, palps, dizziness or syncope   Pt denies new neuro symptoms such as headache, facial or extremity weakness   Has ongoing social stressors as well.  No fever, night sweats, wt loss.  Denies worsening depressive symtpoms, suicidal ideation or panic.    Problems Prior to Update: 1)  Restless Leg Syndrome  (ICD-333.94) 2)  Insomnia-sleep Disorder-unspec  (ICD-780.52) 3)  Chronic Pain Syndrome  (ICD-338.4) 4)  Back Pain, Chronic  (ICD-724.5) 5)  Shoulder Pain, Right  (ICD-719.41) 6)  Other Ill-defined Conditions  (ICD-799.89) 7)  Preventive Health Care  (ICD-V70.0) 8)  Cervical Disc Disorder  (ICD-722.91) 9)  Ibs  (ICD-564.1) 10)  Depression  (ICD-311) 11)  Anxiety  (ICD-300.00) 12)  Nephrolithiasis, Hx of  (ICD-V13.01) 13)  Common Migraine  (ICD-346.10) 14)  Family History Diabetes 1st Degree Relative  (ICD-V18.0) 15)  Headache  (ICD-784.0) 16)  Hypertension  (ICD-401.9)  Medications  Prior to Update: 1)  Percocet 7.5-325 Mg Tabs (Oxycodone-Acetaminophen) .... Generic - 1 By Mouth Q 6 Hrs As Needed Pain 2)  Clonazepam 1 Mg Tbdp (Clonazepam) .Marland Kitchen.. 1po At Bedtime As Needed 3)  Gabapentin 300 Mg Caps (Gabapentin) .Marland Kitchen.. 1 By Mouth Three Times A Day 4)  Lexapro 10 Mg Tabs (Escitalopram Oxalate) .Marland Kitchen.. 1po Once Daily 5)  Losartan Potassium 100 Mg Tabs (Losartan Potassium) .Marland Kitchen.. 1 By Mouth Once Daily  Current Medications (verified): 1)  Percocet 7.5-325 Mg Tabs (Oxycodone-Acetaminophen) .... Generic - 1 By Mouth Q 4 Hrs As Needed 2)  Clonazepam 1 Mg Tbdp (Clonazepam) .Marland Kitchen.. 1po At Bedtime As Needed 3)  Gabapentin 300 Mg Caps (Gabapentin) .Marland Kitchen.. 1 By Mouth Three Times A Day 4)  Lexapro 10 Mg Tabs (Escitalopram Oxalate) .Marland Kitchen.. 1po Once Daily 5)  Losartan Potassium 100 Mg Tabs (Losartan Potassium) .Marland Kitchen.. 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   Head:  normocephalic and atraumatic.   Eyes:  vision grossly intact, pupils equal, and pupils round.   Ears:  R ear normal and no external deformities.   Nose:  no external deformity and no nasal discharge.   Mouth:  no gingival abnormalities and pharynx pink and  moist.   Neck:  supple and no masses.   Lungs:  normal respiratory effort and normal breath sounds.   Heart:  normal rate and regular rhythm.   Msk:  no spine tender, has mild left lumbar paravertebral tender Extremities:  no edema, no erythema  Neurologic:  cranial nerves II-XII intact, strength normal in all extremities, gait normal, and DTRs symmetrical and normal.     Impression & Recommendations:  Problem # 1:  CHRONIC PAIN SYNDROME (ICD-338.4) Assessment Unchanged  ok for one mo refill, but I told pt I decline to manage  her chronic pain medication for the  long term;  she reqeusts neuro referral  for pain management  Orders: Neurology Referral (Neuro)  Problem # 2:  HYPERTENSION (ICD-401.9)  Her updated medication list for this problem includes:    Losartan Potassium 100 Mg Tabs (Losartan potassium) .Marland Kitchen... 1 by mouth once daily  BP today: 156/100 Prior BP: 158/90 (10/28/2009) to increase the losartan as above, f/u BP at home and next visit  Problem # 3:  ANXIETY (ICD-300.00)  Her updated medication list for this problem includes:    Clonazepam 1 Mg Tbdp (Clonazepam) .Marland Kitchen... 1po at bedtime as needed    Lexapro 10 Mg Tabs (Escitalopram oxalate) .Marland Kitchen... 1po once daily stable overall by hx and exam, ok to continue meds/tx as is   Complete Medication List: 1)  Percocet 7.5-325 Mg Tabs (Oxycodone-acetaminophen) .... Generic - 1 by mouth q 4 hrs as needed 2)  Clonazepam 1 Mg Tbdp (Clonazepam) .Marland Kitchen.. 1po at bedtime as needed 3)  Gabapentin 300 Mg Caps (Gabapentin) .Marland Kitchen.. 1 by mouth three times a day 4)  Lexapro 10 Mg Tabs (Escitalopram oxalate) .Marland Kitchen.. 1po once daily 5)  Losartan Potassium 100 Mg Tabs (Losartan potassium) .Marland Kitchen.. 1 by mouth once daily  Patient Instructions: 1)  Continue all previous medications as before this visit  2)  You will be contacted about the referral(s) to: neurology 3)  take 2 of the losartan 50 mg  until gone 4)  Please schedule a follow-up appointment in 2  months with CPX labs Prescriptions: LOSARTAN POTASSIUM 100 MG TABS (LOSARTAN POTASSIUM) 1 by mouth once daily  #90 x 3   Entered and Authorized by:   Corwin Levins MD   Signed by:   Corwin Levins MD on 11/22/2009   Method used:   Print then Give to Patient   RxID:   [id number] PERCOCET 7.5-325 MG TABS (OXYCODONE-ACETAMINOPHEN) generic - 1 by mouth q 4 hrs as needed  #180 x 0   Entered and Authorized by:   Corwin Levins MD   Signed by:   Corwin Levins MD on 11/22/2009   Method used:   Print then Give to Patient   RxID:   [id number]

## 2010-08-29 NOTE — Progress Notes (Signed)
Summary: Referral  Phone Note Call from Patient Call back at Home Phone [phone]   Caller: Patient Summary of Call: Pt called requesting referral to Fibromyalgia specialist. Pt says last Doctor she saw wanted to treat her with injections not "medicaine" and she does not want to continue seeing that Doctor.   *Pt also requested refills of pain meds until Pain Mgmt referral. According to [facility] pt has een filling narcotic pain meds from several doctors. Report printed and given to [facility] for review 05/12/2010* When advised that [facility] declines to refill meds per registry she should not need refills, pt replied "Oh ok, I'll have to look into that then".  Initial call taken by: Margaret Pyle, CMA,  May 17, 2010 9:17 AM  Follow-up for Phone Call        ok for FMS referral, but I will have to declines further pain meds from this office  (pt also to be discharged from the practice soon, I just have not been able to do this yet) Follow-up by: Corwin Levins MD,  May 17, 2010 10:44 AM

## 2010-08-29 NOTE — Progress Notes (Signed)
Summary: Percocet  Phone Note Call from Patient Call back at Home Phone [phone]   Caller: Patient Summary of Call: pt called stating that she was advised by Neuro office that she would not have appt until June or later. Pt is requesting Percocet refill from [facility] until she is able to have OV with Neuro.  Initial call taken by: Margaret Pyle, CMA,  Dec 15, 2009 2:27 PM  Follow-up for Phone Call        pt informed via VM, Rx in cabinet for pt pick up Follow-up by: Margaret Pyle, CMA,  Dec 15, 2009 3:16 PM    Prescriptions: PERCOCET 7.5-325 MG TABS (OXYCODONE-ACETAMINOPHEN) generic - 1 by mouth q 4 hrs as needed  #180 x 0   Entered and Authorized by:   Corwin Levins MD   Signed by:   Corwin Levins MD on 12/15/2009   Method used:   Print then Give to Patient   RxID:   [id number]  done hardcopy to LIM side B - dahlia  Corwin Levins MD  Dec 15, 2009 3:07 PM

## 2010-08-29 NOTE — Op Note (Signed)
Summary: Claudette Laws MD  Claudette Laws MD   Imported By: Sherian Rein 06/01/2010 13:17:20  _____________________________________________________________________  External Attachment:    Type:   Image     Comment:   External Document

## 2010-08-29 NOTE — Progress Notes (Signed)
Summary: Lexapro PA  Phone Note From Pharmacy   Caller: [employer] [place]--[phone] Call For: ID:  [phone]  Summary of Call: PA request--Lexapro. The preferred meds are citalopram, fluoxetine, fluvoxamine, paroxetine, and sertraline. Please advise. Initial call taken by: Lucious Groves,  October 17, 2009 10:54 AM  Follow-up for Phone Call        ok for pa Follow-up by: Corwin Levins MD,  October 17, 2009 1:13 PM  Additional Follow-up for Phone Call Additional follow up Details #1::        forms completed and faxed to insurance company, will await their reply. Additional Follow-up by: Lucious Groves,  October 18, 2009 1:24 PM     Appended Document: Lexapro PA Approved until 07-15-2012.

## 2010-08-29 NOTE — Letter (Signed)
Summary: Phone call notes/[facility]  Phone call notes/[facility]   Imported By: Lester Klagetoh 03/10/2010 09:58:58  _____________________________________________________________________  External Attachment:    Type:   Image     Comment:   External Document

## 2010-08-29 NOTE — Progress Notes (Signed)
Summary: Early refill?  Phone Note Call from Patient Call back at [facility] Phone [phone]   Caller: Patient Summary of Call: Pt called requesting MD's okay to have her July Percocet filled tomorrow rather than July 16. Pt says she will be going on Vacation and will be leaving tomorrow afternoon. Initial call taken by: Margaret Pyle, CMA,  February 08, 2010 1:52 PM  Follow-up for Phone Call        ok for early refill this time only Follow-up by: Corwin Levins MD,  February 08, 2010 4:47 PM  Additional Follow-up for Phone Call Additional follow up Details #1::        pt informed and will have pharmacy call to verify if needed. Additional Follow-up by: Margaret Pyle, CMA,  February 09, 2010 8:18 AM

## 2010-08-29 NOTE — Progress Notes (Signed)
Summary: Percocet  Phone Note Call from Patient Call back at Home Phone [phone]   Caller: Patient Summary of Call: Pt called stating that she is scheduled to have Arthoscopic shoulder surgery with Ortho MD 03/22/2010. Pt says that Ortho MD will treat pain temporarily after surgery and she will go to pain mgmt then. Called Ortho MD Dr Madelon Lips and verified that they will only treat post-op pain for 6-8 weeks. Pt is requesting refill of Percocet 7.5mg -325mg  1 by mouth q 4 hrs. Pt states that the increased frequency controlled her pain better. Initial call taken by: Margaret Pyle, CMA,  March 06, 2010 1:45 PM  Follow-up for Phone Call        Ptc alled back and decided to hold on requesting refill at this time. Pt says she will space out the balance of her medication up until her surgery. Follow-up by: Margaret Pyle, CMA,  March 06, 2010 2:39 PM  Additional Follow-up for Phone Call Additional follow up Details #1::        sorry, I can only support use of 4 per day (q 6 hrs)  ok for rx to aug 24 surgury - not due for rx until aug 15 Additional Follow-up by: Corwin Levins MD,  March 06, 2010 2:48 PM    Additional Follow-up for Phone Call Additional follow up Details #2::    Pt informed, Rx in cabinet for pt pick up Follow-up by: Margaret Pyle, CMA,  March 06, 2010 3:32 PM  New/Updated Medications: PERCOCET 10-325 MG TABS (OXYCODONE-ACETAMINOPHEN) 1po q 6 hrs as needed - to fill Mar 13, 2010 Prescriptions: PERCOCET 10-325 MG TABS (OXYCODONE-ACETAMINOPHEN) 1po q 6 hrs as needed - to fill Mar 13, 2010  #40 x 0   Entered and Authorized by:   Corwin Levins MD   Signed by:   Corwin Levins MD on 03/06/2010   Method used:   Print then Give to Patient   RxID:   [id number]

## 2010-08-29 NOTE — Assessment & Plan Note (Signed)
Summary: FU--STC   Vital Signs:  Patient profile:   40 year old female Height:      62 inches Weight:      142 pounds BMI:     26.07 O2 Sat:      99 % on Room air Temp:     97.6 degrees F oral Pulse rate:   114 / minute BP sitting:   140 / 86  (left arm) Cuff size:   regular  Vitals Entered By: Zella Ball Ewing CMA Duncan Dull) (April 12, 2010 4:14 PM)  O2 Flow:  Room air CC: Followup from surgery, refills/RE   CC:  Followup from surgery and refills/RE.  History of Present Illness: here to f/u - overall states she has still signficant pain sp recent right shoudler rotater cuff tear,  has been approved for new pain clinic but needs psychological eval prior to first visti which has to be scheduled but thinks can be done in the next month;  needs refills other meds as well,  sleeping ok on current med, and denies worsening depressive symtpoms, suicial ideation or panic.  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  Ok for flu shot today.  Has seen rheum with + RF adn ANA but symtpoms felt c/w FMS.  Pt denies CP, worsening sob, doe, wheezing, orthopnea, pnd, worsening LE edema, palps, dizziness or syncope  Pt denies new neuro symptoms such as headache, facial or extremity weakness Overall good compliacne with meds, good tolerability.  Problems Prior to Update: 1)  Preventive Health Care  (ICD-V70.0) 2)  Rheumatoid Factor, Positive  (ICD-790.99) 3)  Fibromyalgia  (ICD-729.1) 4)  Dysuria  (ICD-788.1) 5)  Lumbar Radiculopathy, Left  (ICD-724.4) 6)  Restless Leg Syndrome  (ICD-333.94) 7)  Insomnia-sleep Disorder-unspec  (ICD-780.52) 8)  Chronic Pain Syndrome  (ICD-338.4) 9)  Back Pain, Chronic  (ICD-724.5) 10)  Shoulder Pain, Right  (ICD-719.41) 11)  Other Ill-defined Conditions  (ICD-799.89) 12)  Preventive Health Care  (ICD-V70.0) 13)  Cervical Disc Disorder   (ICD-722.91) 14)  Ibs  (ICD-564.1) 15)  Depression  (ICD-311) 16)  Anxiety  (ICD-300.00) 17)  Nephrolithiasis, Hx of  (ICD-V13.01) 18)  Common Migraine  (ICD-346.10) 19)  Family History Diabetes 1st Degree Relative  (ICD-V18.0) 20)  Headache  (ICD-784.0) 21)  Hypertension  (ICD-401.9)  Medications Prior to Update: 1)  Percocet 10-325 Mg Tabs (Oxycodone-Acetaminophen) .Marland Kitchen.. 1po Q 6 Hrs As Needed - To Fill Mar 13, 2010 2)  Clonazepam 1 Mg Tbdp (Clonazepam) .Marland Kitchen.. 1po At Bedtime As Needed 3)  Cymbalta 60 Mg Cpep (Duloxetine Hcl) .Marland Kitchen.. 1po Once Daily 4)  Losartan Potassium-Hctz 100-25 Mg Tabs (Losartan Potassium-Hctz) .Marland Kitchen.. 1po Once Daily 5)  Cephalexin 500 Mg Caps (Cephalexin) .Marland Kitchen.. 1 By Mouth Three Times A Day  Current Medications (verified): 1)  Clonazepam 1 Mg Tbdp (Clonazepam) .Marland Kitchen.. 1po At Bedtime As Needed 2)  Cymbalta 60 Mg Cpep (Duloxetine Hcl) .Marland Kitchen.. 1po Once Daily 3)  Losartan Potassium-Hctz 100-25 Mg Tabs (Losartan Potassium-Hctz) .Marland Kitchen.. 1po Once Daily 4)  Oxycodone Hcl 15 Mg Tabs (Oxycodone Hcl) .Marland Kitchen.. 1 By Mouth Four Times Per Day As Needed Pain  Allergies (verified): No Known Drug Allergies  Past History:  Past Medical History: Last updated: 01/12/2010 Hypertension Migraines Arthritis Nephrolithiasis, hx of Anxiety Depression IBS c-spine disc disease lumbar disc disease RLS FMS chronic pain syndrome  Past Surgical History: Last  updated: 12/21/2008 Cholecystectomy Lithotripsy 2002  Caesarean section x 2 s/p endometrial ablation for endometriosis 2005/amenorheic  Social History: Last updated: 12/21/2008 Occupation: self -employed - cleaning businss since 2002 Married Never Smoked Alcohol use-no Drug use-no Regular exercise-yes 2 daughters  Risk Factors: Exercise: yes (04/10/2007)  Risk Factors: Smoking Status: never (04/10/2007)  Review of Systems       all otherwise negative per pt -    Physical Exam  General:  alert and overweight-appearing.     Head:  normocephalic and atraumatic.   Eyes:  vision grossly intact, pupils equal, and pupils round.   Ears:  R ear normal and L ear normal.   Nose:  no external deformity and no nasal discharge.   Mouth:  no gingival abnormalities and pharynx pink and moist.   Neck:  supple and no masses.   Lungs:  normal respiratory effort and normal breath sounds.   Heart:  normal rate and regular rhythm.   Msk:  right arm in sling;  scar healing to right shoulder from recent surgury Extremities:  no edema, no erythema  Neurologic:  strength normal in all extremities and gait normal.   Psych:  dysphoric affect and moderately anxious.     Impression & Recommendations:  Problem # 1:  HYPERTENSION (ICD-401.9)  Her updated medication list for this problem includes:    Losartan Potassium-hctz 100-25 Mg Tabs (Losartan potassium-hctz) .Marland Kitchen... 1po once daily  BP today: 140/86 Prior BP: 156/100 (01/12/2010)  Labs Reviewed: K+: 4.3 (01/12/2010) Creat: : 0.5 (01/12/2010)   Chol: 171 (01/12/2010)   HDL: 55.50 (01/12/2010)   LDL: 87 (01/12/2010)   TG: 145.0 (01/12/2010) improved, stable overall by hx and exam, ok to continue meds/tx as is   Problem # 2:  CHRONIC PAIN SYNDROME (ICD-338.4) for pain med refills today - to f/u pain management as soon as able with psych eval and then pain eval  Problem # 3:  INSOMNIA-SLEEP DISORDER-UNSPEC (ICD-780.52) stable overall by hx and exam, ok to continue meds/tx as is  - refills done  Problem # 4:  DEPRESSION (ICD-311)  Her updated medication list for this problem includes:    Clonazepam 1 Mg Tbdp (Clonazepam) .Marland Kitchen... 1po at bedtime as needed    Cymbalta 60 Mg Cpep (Duloxetine hcl) .Marland Kitchen... 1po once daily stable overall by hx and exam, ok to continue meds/tx as is -   Complete Medication List: 1)  Clonazepam 1 Mg Tbdp (Clonazepam) .Marland Kitchen.. 1po at bedtime as needed 2)  Cymbalta 60 Mg Cpep (Duloxetine hcl) .Marland Kitchen.. 1po once daily 3)  Losartan Potassium-hctz 100-25 Mg Tabs  (Losartan potassium-hctz) .Marland Kitchen.. 1po once daily 4)  Oxycodone Hcl 15 Mg Tabs (Oxycodone hcl) .Marland Kitchen.. 1 by mouth four times per day as needed pain  Other Orders: Admin 1st Vaccine (13086) Flu Vaccine 76yrs + (57846)  Patient Instructions: 1)  you had the flu shot today 2)  Please take all new medications as prescribed 3)  Continue all previous medications as before this visit  4)  Please keep your followup appt with surgury, and psychological evaluationa and pain management next month 5)  Please schedule a follow-up appointment in 1 year, or sooner if needed Prescriptions: CLONAZEPAM 1 MG TBDP (CLONAZEPAM) 1po at bedtime as needed  #30 x 5   Entered and Authorized by:   Corwin Levins MD   Signed by:   Corwin Levins MD on 04/12/2010   Method used:   Print then Give to Patient   RxID:   [id number]  OXYCODONE HCL 15 MG TABS (OXYCODONE HCL) 1 by mouth four times per day as needed pain  #120 x 0   Entered and Authorized by:   Corwin Levins MD   Signed by:   Corwin Levins MD on 04/12/2010   Method used:   Print then Give to Patient   RxID:   [id number]   Flu Vaccine Consent Questions     Do you have a history of severe allergic reactions to this vaccine? no    Any prior history of allergic reactions to egg and/or gelatin? no    Do you have a sensitivity to the preservative Thimersol? no    Do you have a past history of Guillan-Barre Syndrome? no    Do you currently have an acute febrile illness? no    Have you ever had a severe reaction to latex? no    Vaccine information given and explained to patient? yes    Are you currently pregnant? no    Lot Number:[id number]   Exp Date:01/27/2011   Site Given  Left Deltoid IMbflu

## 2010-08-29 NOTE — Progress Notes (Signed)
Summary: Referral  Phone Note Call from Patient Call back at Home Phone [phone]   Caller: Patient Summary of Call: Pt called requesting a referral to a MD that specialty is treatment of Fibromyalgia. Pt states that Rheumatologist she saw does not treat Fibromyalgia and she is still having pain. Initial call taken by: Margaret Pyle, CMA,  February 21, 2010 1:58 PM  Follow-up for Phone Call        to [facility] - is there such a specialist? Follow-up by: Corwin Levins MD,  February 21, 2010 2:47 PM  Additional Follow-up for Phone Call Additional follow up Details #1::        Dr Jonny Ruiz, I called [facility] there is not a doctor that specialize in fibromyalgia . Additional Follow-up by: Dagoberto Reef,  February 22, 2010 10:08 AM    Additional Follow-up for Phone Call Additional follow up Details #2::    ok to refer to pain clinic - though I think she has been referred already -  Follow-up by: Corwin Levins MD,  February 22, 2010 10:56 AM

## 2010-08-29 NOTE — Letter (Signed)
Summary: Dismissal from the Practice Letter  [facility]  [address] [place], [place] [address]   Phone: [phone]  Fax: [phone]    05/30/2010  Morgan James [address] [place], [place]  [address]  Dear Ms. KOBLER,       It has come to my attention on review of the [facility]  Controlled Substance database, that you have requested and   received narcotic prescriptions including hydrocodone and  oxycodone from multiple physicians, some overalapping in time  that indicates concomintant use.  For example, you most recently  recieved #120 oxycodone 15 mg tabs to last 1 month, then received  #60 percocet 7.5/500 just 2 weeks later.  Due to the misuse of  narcotic prescription, I find I must resign as your primary care  physician, and you are to be dismissed from the practice, no later  than 30 days from the date of this letter.  As you are in need of  ongoing medical care, please feel free to request any or all   medical records from our Medical Records Department.  If you have  have difficulty finding a new physician, please call [facility] Physician Referral Service.  We will be available for emergency  care for 30 days from the date of this letter.      Sincerely,   Oliver Barre MD  Appended Document: Dismissal from the Practice Letter IDX and EMR updated to reflect dismissal. Letter sent out by Certified Mail.  Appended Document: Dismissal from the Practice Letter Letter returned by [employer]PS after three unsuccessful attempts to deliver. Letter resent by first class mail which does not require a signature.

## 2010-08-29 NOTE — Progress Notes (Signed)
Summary: Neuro referral  Phone Note Call from Patient Call back at Home Phone [phone]   Summary of Call: Patient would like referral to Neuro. Called patient to discuss and line would not allow me to leave message. Initial call taken by: Lucious Groves,  October 17, 2009 3:53 PM  Follow-up for Phone Call        left message on machine for pt to return my call. Margaret Pyle, CMA  October 18, 2009 10:35 AM   Additional Follow-up for Phone Call Additional follow up Details #1::        Pt states that she was referred to Neuro before and was told that nothing could be done but she would still like referral for pain in neck and RT shoulder. Pt says that this is causing sleep problems. pt wants to be referred to Dr. Gerilyn Pilgrim [phone]. Referral or OV? Additional Follow-up by: Margaret Pyle, CMA,  October 18, 2009 11:10 AM    Additional Follow-up for Phone Call Additional follow up Details #2::    based on her exam here , and I have reviewed her record from ortho and pain management;  she does not seem to even possibly have neurological basis for the pain, so unfortunately, I would have to say it appears she does not have a need to see neurology Follow-up by: Corwin Levins MD,  October 18, 2009 1:42 PM  Additional Follow-up for Phone Call Additional follow up Details #3:: Details for Additional Follow-up Action Taken: pt informed via VM, told to call back if she had any further questions or concerns Additional Follow-up by: Margaret Pyle, CMA,  October 18, 2009 3:30 PM

## 2010-08-29 NOTE — Consult Note (Signed)
Summary: [facility]   [facility]   Imported By: Sherian Rein 02/06/2010 10:45:29  _____________________________________________________________________  External Attachment:    Type:   Image     Comment:   External Document

## 2010-08-29 NOTE — Progress Notes (Signed)
  Phone Note Call from Patient   Caller: Patient Summary of Call: Left msg on VM recieved call from office. not sure who was calling.  Initial call taken by: Orlan Leavens,  August 02, 2009 3:29 PM  Follow-up for Phone Call        pt called to inform MD that she had MRI 12.23 and will keep her appt for 01/19 to discuss Follow-up by: Margaret Pyle, CMA,  August 03, 2009 8:14 AM

## 2010-08-29 NOTE — Letter (Signed)
Summary: Erick Colace MD  Erick Colace MD   Imported By: Sherian Rein 06/01/2010 13:16:10  _____________________________________________________________________  External Attachment:    Type:   Image     Comment:   External Document

## 2010-08-29 NOTE — Medication Information (Signed)
Summary: Pt's Prescription History  Pt's Prescription History   Imported By: Sherian Rein 06/01/2010 13:13:45  _____________________________________________________________________  External Attachment:    Type:   Image     Comment:   External Document

## 2010-08-29 NOTE — Miscellaneous (Signed)
Summary: Orders Update  Clinical Lists Changes  Problems: Added new problem of RHEUMATOID FACTOR, POSITIVE (ICD-790.99) Orders: Added new Referral order of Rheumatology Referral (Rheumatology) - Signed

## 2010-08-29 NOTE — Medication Information (Signed)
Summary: [facility] for Lexapro/[employer]  PA for Lexapro/[employer]   Imported By: Sherian Rein 10/20/2009 11:30:18  _____________________________________________________________________  External Attachment:    Type:   Image     Comment:   External Document

## 2010-08-30 ENCOUNTER — Encounter: Payer: Self-pay | Admitting: Family Medicine

## 2010-12-15 NOTE — Op Note (Signed)
Morgan James, Morgan James                 ACCOUNT NO.:  [id number]   MEDICAL RECORD NO.:  [id number]          PATIENT TYPE:  AMB   LOCATION:  SDC                           FACILITY:  [facility]   PHYSICIAN:  Malva Limes, M.D.    DATE OF BIRTH:  Jul 03, 1971   DATE OF PROCEDURE:  09/24/2005  DATE OF DISCHARGE:                                 OPERATIVE REPORT   PREOPERATIVE DIAGNOSES:  1.  Menorrhagia.  2.  Uterine fibroids.   POSTOPERATIVE DIAGNOSES:  1.  Menorrhagia.  2.  Uterine fibroids.   OPERATION/PROCEDURE:  1.  Dilatation and curettage.  2.  NovaSure endometrial ablation.   SURGEON:  Malva Limes, M.D.   ANESTHESIA:  MAC and paracervical block.   ANTIBIOTICS:  Ancef 1 g.   DRAINS:  Red rubber catheter to bladder.   COMPLICATIONS:  None.   SPECIMENS:  Endometrial curettings sent to pathology.   DESCRIPTION OF PROCEDURE:  The patient was taken to the operating room where  she was placed in the dorsal supine position.  MAC anesthesia was  administered.  She was then placed in the dorsal lithotomy position  She was  prepped with Betadine and her bladder drained with the red rubber catheter.  She was placed in the dorsal lithotomy position.  Examination under  anesthesia revealed anteverted uterus at approximately nine weeks in size.  A fundal fibroid was felt.  At this point then 20 mL of 1% lidocaine was  used for a paracervical block.  Cervix was then sterilely dilated to a 32-  [place].  The uterus was then sounded to 10 cm.  A sharp curettage was then  performed with tissue sent to pathology consistent with a polyp.  Following  this, the endocervical length was measured. The endocervical length measured  and cavity length of 6.5 was determined.  The NovaSure device was then  placed in the uterine cavity, seating procedure was performed.  The uterine  cavity was 4.7 cm in width.  At this point a seal test was performed and  passed.  Following this, the device was then turned on  for 49 seconds, total  wattage of 168.  The procedure was completed at this point.  The device was  removed.  The patient was then taken to the recovery room in stable  condition.   She will be discharged to home.  She will be sent home and instructed to  take Advil as needed.  She will follow up in the office in one month.           ______________________________  Malva Limes, M.D.     MA/MEDQ  D:  09/24/2005  T:  09/24/2005  Job:  [id number]

## 2010-12-15 NOTE — Op Note (Signed)
NAME:  Morgan James, Morgan James                           ACCOUNT NO.:  [id number]   MEDICAL RECORD NO.:  [id number]                   PATIENT TYPE:  OBV   LOCATION:  0101                                 [facility]ACILITY[facility]  [facility]   PHYSICIAN:  Lorre Munroe., M.D.            DATE OF BIRTH:  1971/02/04   DATE OF PROCEDURE:  02/05/2004  DATE OF DISCHARGE:                                 OPERATIVE REPORT   PREOPERATIVE DIAGNOSIS:  Symptomatic cholelithiasis and chronic  cholecystitis.   POSTOPERATIVE DIAGNOSIS:  Symptomatic cholelithiasis and chronic  cholecystitis.   OPERATION/PROCEDURE:  Laparoscopic cholecystectomy.   SURGEON:  Lebron Conners, M.D.   ANESTHESIA:  General.   DESCRIPTION OF PROCEDURE:  After the patient was monitored and anesthetized  and had routine preparation and draping of the abdomen, I infiltrated local  anesthetic at four sites, two in the mid portion of the right abdomen, one  in the epigastrium, and one just below the umbilicus.  I made a short  transverse infraumbilical incision, incised the fascia longitudinally,  opened the peritoneum bluntly and secured a Hasson cannula with a 0 Vicryl  pursestring suture in the fascia.  After inflating the abdomen with C02, I  examined the contents and saw healthy-appearing uterus and pelvic organs,  and all of the GI tract appeared normal as far as I could see.  The  gallbladder was somewhat distended and had adhesions of omentum to the  undersurface.  After placement of three additional ports, under direct  vision and positioning the patient's head up and foot down and tilted to the  right,  I grasped the fundus of the gallbladder and retracted it toward the  right shoulder, then took down the adhesions bluntly and with cautery.  I  was able to see the infundibulum of the gallbladder and the normal-appearing  biliary anatomy quite easily, I pulled the infundibulum laterally and  dissected out the cystic duct and cystic artery a  clipped the cystic artery  with three clips and cut between the two closest to the gallbladder.  I  placed a clip on the cystic duct toward the gallbladder and made a small  hole just distal to that and inserted a cholangiogram catheter secured by  one clip and performed fluoroscopic cholangiogram which showed normal  biliary anatomy with normal size ducts, rapid flow into the duodenum and no  evidence of any filling defects that I could see.  I then removed the  cholangiogram catheter and placed three clips distal to the hole and divided  the cystic duct.  I dissected the gallbladder from the liver using hook and  spatula cautery devices and got hemostasis with cautery.  Hemostasis was  excellent.  I briefly irrigated the right upper quadrant and removed the  irrigant.  I removed the gallbladder from the body through the umbilical  incision and tied the pursestring suture.  I removed the lateral ports  under  direct vision, then allowed the CO2 to escape, removed the epigastric port.  I closed the skin incisions with intracuticular 4-0 Vicryl and Steri-Strips.  The patient tolerated the operation very well.                                               Lorre Munroe., M.D.    Jodi Marble  D:  02/05/2004  T:  02/05/2004  Job:  [id number]

## 2011-04-24 LAB — DIFFERENTIAL
Basophils Absolute: 0
Basophils Relative: 0
Eosinophils Relative: 1
Lymphs Abs: 1.3
Monocytes Absolute: 0.2
Monocytes Relative: 5

## 2011-04-24 LAB — URINALYSIS, ROUTINE W REFLEX MICROSCOPIC
Bilirubin Urine: NEGATIVE
Hgb urine dipstick: NEGATIVE
Nitrite: NEGATIVE
Protein, ur: NEGATIVE
Specific Gravity, Urine: 1.008
Urobilinogen, UA: 0.2

## 2011-04-24 LAB — COMPREHENSIVE METABOLIC PANEL
Albumin: 3.6
Alkaline Phosphatase: 31 — ABNORMAL LOW
Calcium: 8.7
GFR calc non Af Amer: >60
Sodium: 138
Total Bilirubin: 0.7

## 2011-04-24 LAB — CBC
HCT: 36.8
Hemoglobin: 13
MCHC: 35.2
MCV: 93.4
RBC: 3.94
RDW: 12

## 2011-04-30 LAB — URINALYSIS, ROUTINE W REFLEX MICROSCOPIC
Bilirubin Urine: NEGATIVE
Glucose, UA: NEGATIVE
Hgb urine dipstick: NEGATIVE
Ketones, ur: NEGATIVE
Nitrite: NEGATIVE
Protein, ur: NEGATIVE
Specific Gravity, Urine: 1.003 — ABNORMAL LOW
Urobilinogen, UA: 0.2
pH: 7.5

## 2011-04-30 LAB — PREGNANCY, URINE: Preg Test, Ur: NEGATIVE

## 2011-05-15 LAB — POCT PREGNANCY, URINE: Operator id: 239701

## 2011-05-15 LAB — URINE MICROSCOPIC-ADD ON

## 2011-05-15 LAB — URINE CULTURE

## 2011-05-15 LAB — WET PREP, GENITAL: Yeast Wet Prep HPF POC: NONE SEEN

## 2011-05-15 LAB — URINALYSIS, ROUTINE W REFLEX MICROSCOPIC
Glucose, UA: NEGATIVE
Nitrite: NEGATIVE
Protein, ur: NEGATIVE
pH: 6.5

## 2011-05-15 LAB — POCT URINALYSIS DIP (DEVICE)
Ketones, ur: NEGATIVE
Operator id: 126491
Specific Gravity, Urine: 1.015
Urobilinogen, UA: 0.2
pH: 7.5

## 2011-05-15 LAB — GC/CHLAMYDIA PROBE AMP, GENITAL: Chlamydia, DNA Probe: NEGATIVE

## 2011-11-27 ENCOUNTER — Encounter (HOSPITAL_COMMUNITY): Payer: Self-pay | Admitting: Emergency Medicine

## 2011-11-27 ENCOUNTER — Emergency Department (HOSPITAL_COMMUNITY)
Admission: EM | Admit: 2011-11-27 | Discharge: 2011-11-27 | Disposition: A | Discharge status: Home / Self Care | Payer: BC Managed Care – PPO | Attending: Emergency Medicine | Admitting: Emergency Medicine

## 2011-11-27 DIAGNOSIS — I7300 Raynaud's syndrome without gangrene: Secondary | ICD-10-CM | POA: Insufficient documentation

## 2011-11-27 DIAGNOSIS — F419 Anxiety disorder, unspecified: Secondary | ICD-10-CM

## 2011-11-27 DIAGNOSIS — IMO0001 Reserved for inherently not codable concepts without codable children: Secondary | ICD-10-CM | POA: Insufficient documentation

## 2011-11-27 DIAGNOSIS — M1610 Unilateral primary osteoarthritis, unspecified hip: Secondary | ICD-10-CM | POA: Insufficient documentation

## 2011-11-27 DIAGNOSIS — F411 Generalized anxiety disorder: Secondary | ICD-10-CM | POA: Insufficient documentation

## 2011-11-27 DIAGNOSIS — F17200 Nicotine dependence, unspecified, uncomplicated: Secondary | ICD-10-CM | POA: Insufficient documentation

## 2011-11-27 DIAGNOSIS — M25559 Pain in unspecified hip: Secondary | ICD-10-CM | POA: Insufficient documentation

## 2011-11-27 DIAGNOSIS — M3500 Sicca syndrome, unspecified: Secondary | ICD-10-CM | POA: Insufficient documentation

## 2011-11-27 HISTORY — DX: Unspecified osteoarthritis, unspecified site: M19.90

## 2011-11-27 HISTORY — DX: Fibromyalgia: M79.7

## 2011-11-27 HISTORY — DX: Sjogren syndrome, unspecified: M35.00

## 2011-11-27 HISTORY — DX: Other shoulder lesions, unspecified shoulder: M75.80

## 2011-11-27 HISTORY — DX: Raynaud's syndrome without gangrene: I73.00

## 2011-11-27 MED ORDER — HYDROMORPHONE HCL PF 2 MG/ML IJ SOLN: 2.0000 mg | Freq: Once | INTRAMUSCULAR | Status: DC

## 2011-11-27 MED ORDER — HYDROMORPHONE HCL PF 1 MG/ML IJ SOLN
1.0000 mg | Freq: Once | INTRAMUSCULAR | Status: AC
  Administered 2011-11-27: 1 mg via INTRAVENOUS
  Filled 2011-11-27: qty 1

## 2011-11-27 MED ORDER — OXYCODONE-ACETAMINOPHEN 5-325 MG PO TABS: 1.0000 | ORAL_TABLET | Freq: Four times a day (QID) | ORAL | Status: AC | PRN

## 2011-11-27 MED ORDER — LORAZEPAM 1 MG PO TABS
2.0000 mg | ORAL_TABLET | Freq: Once | ORAL | Status: AC
  Administered 2011-11-27: 2 mg via ORAL
  Filled 2011-11-27: qty 2

## 2011-11-27 MED ORDER — ONDANSETRON HCL 4 MG/2ML IJ SOLN
4.0000 mg | Freq: Once | INTRAMUSCULAR | Status: AC
  Administered 2011-11-27: 4 mg via INTRAVENOUS
  Filled 2011-11-27: qty 2

## 2011-11-27 MED ORDER — SODIUM CHLORIDE 0.9 % IV SOLN
Freq: Once | INTRAVENOUS | Status: AC
  Administered 2011-11-27: 04:00:00 via INTRAVENOUS

## 2011-11-27 MED ORDER — KETOROLAC TROMETHAMINE 30 MG/ML IJ SOLN
30.0000 mg | Freq: Once | INTRAMUSCULAR | Status: AC
  Administered 2011-11-27: 30 mg via INTRAVENOUS
  Filled 2011-11-27: qty 1

## 2011-11-27 NOTE — Discharge Instructions (Signed)
Anxiety and Panic Attacks Your caregiver has informed you that you are having an anxiety or panic attack. There may be many forms of this. Most of the time these attacks come suddenly and without warning. They come at any time of day, including periods of sleep, and at any time of life. They may be strong and unexplained. Although panic attacks are very scary, they are physically harmless. Sometimes the cause of your anxiety is not known. Anxiety is a protective mechanism of the body in its fight or flight mechanism. Most of these perceived danger situations are actually nonphysical situations (such as anxiety over losing a job). CAUSES  The causes of an anxiety or panic attack are many. Panic attacks may occur in otherwise healthy people given a certain set of circumstances. There may be a genetic cause for panic attacks. Some medications may also have anxiety as a side effect. SYMPTOMS  Some of the most common feelings are:  Intense terror.   Dizziness, feeling faint.   Hot and cold flashes.   Fear of going crazy.   Feelings that nothing is real.   Sweating.   Shaking.   Chest pain or a fast heartbeat (palpitations).   Smothering, choking sensations.   Feelings of impending doom and that death is near.   Tingling of extremities, this may be from over-breathing.   Altered reality (derealization).   Being detached from yourself (depersonalization).  Several symptoms can be present to make up anxiety or panic attacks. DIAGNOSIS  The evaluation by your caregiver will depend on the type of symptoms you are experiencing. The diagnosis of anxiety or panic attack is made when no physical illness can be determined to be a cause of the symptoms. TREATMENT  Treatment to prevent anxiety and panic attacks may include:  Avoidance of circumstances that cause anxiety.   Reassurance and relaxation.   Regular exercise.   Relaxation therapies, such as yoga.   Psychotherapy with a  psychiatrist or therapist.   Avoidance of caffeine, alcohol and illegal drugs.   Prescribed medication.  SEEK IMMEDIATE MEDICAL CARE IF:   You experience panic attack symptoms that are different than your usual symptoms.   You have any worsening or concerning symptoms.  Document Released: 07/16/2005 Document Revised: 07/05/2011 Document Reviewed: 11/17/2009 [facility] Patient Information 2012 [place], [place].Arthralgia Your caregiver has diagnosed you as suffering from an arthralgia. Arthralgia means there is pain in a joint. This can come from many reasons including:  Bruising the joint which causes soreness (inflammation) in the joint.   Wear and tear on the joints which occur as we grow older (osteoarthritis).   Overusing the joint.   Various forms of arthritis.   Infections of the joint.  Regardless of the cause of pain in your joint, most of these different pains respond to anti-inflammatory drugs and rest. The exception to this is when a joint is infected, and these cases are treated with antibiotics, if it is a bacterial infection. HOME CARE INSTRUCTIONS   Rest the injured area for as long as directed by your caregiver. Then slowly start using the joint as directed by your caregiver and as the pain allows. Crutches as directed may be useful if the ankles, knees or hips are involved. If the knee was splinted or casted, continue use and care as directed. If an stretchy or elastic wrapping bandage has been applied today, it should be removed and re-applied every 3 to 4 hours. It should not be applied tightly, but firmly enough  to keep swelling down. Watch toes and feet for swelling, bluish discoloration, coldness, numbness or excessive pain. If any of these problems (symptoms) occur, remove the ace bandage and re-apply more loosely. If these symptoms persist, contact your caregiver or return to this location.   For the first 24 hours, keep the injured extremity elevated on pillows while  lying down.   Apply ice for 15 to 20 minutes to the sore joint every couple hours while awake for the first half day. Then 3 to 4 times per day for the first 48 hours. Put the ice in a plastic bag and place a towel between the bag of ice and your skin.   Wear any splinting, casting, elastic bandage applications, or slings as instructed.   Only take over-the-counter or prescription medicines for pain, discomfort, or fever as directed by your caregiver. Do not use aspirin immediately after the injury unless instructed by your physician. Aspirin can cause increased bleeding and bruising of the tissues.   If you were given crutches, continue to use them as instructed and do not resume weight bearing on the sore joint until instructed.  Persistent pain and inability to use the sore joint as directed for more than 2 to 3 days are warning signs indicating that you should see a caregiver for a follow-up visit as soon as possible. Initially, a hairline fracture (break in bone) may not be evident on X-rays. Persistent pain and swelling indicate that further evaluation, non-weight bearing or use of the joint (use of crutches or slings as instructed), or further X-rays are indicated. X-rays may sometimes not show a small fracture until a week or 10 days later. Make a follow-up appointment with your own caregiver or one to whom we have referred you. A radiologist (specialist in reading X-rays) may read your X-rays. Make sure you know how you are to obtain your X-Langi results. Do not assume everything is normal if you do not hear from [place]. SEEK MEDICAL CARE IF: Bruising, swelling, or pain increases. SEEK IMMEDIATE MEDICAL CARE IF:   Your fingers or toes are numb or blue.   The pain is not responding to medications and continues to stay the same or get worse.   The pain in your joint becomes severe.   You develop a fever over 102 F (38.9 C).   It becomes impossible to move or use the joint.  MAKE SURE YOU:     Understand these instructions.   Will watch your condition.   Will get help right away if you are not doing well or get worse.  Document Released: 07/16/2005 Document Revised: 07/05/2011 Document Reviewed: 03/03/2008 [facility] Patient Information 2012 [place], [place].  Hip Pain The hips join the upper legs to the lower pelvis. The bones, cartilage, tendons, and muscles of the hip joint perform a lot of work each day holding your body weight and allowing you to move around. Hip pain is a common symptom. It can range from a minor ache to severe pain on 1 or both hips. Pain may be felt on the inside of the hip joint near the groin, or the outside near the buttocks and upper thigh. There may be swelling or stiffness as well. It occurs more often when a person walks or performs activity. There are many reasons hip pain can develop. CAUSES  It is important to work with your caregiver to identify the cause since many conditions can impact the bones, cartilage, muscles, and tendons of the hips. Causes  for hip pain include:  Broken (fractured) bones.   Separation of the thighbone from the hip socket (dislocation).   Torn cartilage of the hip joint.   Swelling (inflammation) of a tendon (tendonitis), the sac within the hip joint (bursitis), or a joint.   A weakening in the abdominal wall (hernia), affecting the nerves to the hip.   Arthritis in the hip joint or lining of the hip joint.   Pinched nerves in the back, hip, or upper thigh.   A bulging disc in the spine (herniated disc).   Rarely, bone infection or cancer.  DIAGNOSIS  The location of your hip pain will help your caregiver understand what may be causing the pain. A diagnosis is based on your medical history, your symptoms, results from your physical exam, and results from diagnostic tests. Diagnostic tests may include X-Pate exams, a computerized magnetic scan (magnetic resonance imaging, MRI), or bone scan. TREATMENT  Treatment  will depend on the cause of your hip pain. Treatment may include:  Limiting activities and resting until symptoms improve.   Crutches or other walking supports (a cane or brace).   Ice, elevation, and compression.   Physical therapy or home exercises.   Shoe inserts or special shoes.   Losing weight.   Medications to reduce pain.   Undergoing surgery.  HOME CARE INSTRUCTIONS   Only take over-the-counter or prescription medicines for pain, discomfort, or fever as directed by your caregiver.   Put ice on the injured area:   Put ice in a plastic bag.   Place a towel between your skin and the bag.   Leave the ice on for 15 to 20 minutes at a time, 3 to 4 times a day.   Keep your leg raised (elevated) when possible to lessen swelling.   Avoid activities that cause pain.   Follow specific exercises as directed by your caregiver.   Sleep with a pillow between your legs on your most comfortable side.   Record how often you have hip pain, the location of the pain, and what it feels like. This information may be helpful to you and your caregiver.   Ask your caregiver about returning to work or sports and whether you should drive.   Follow up with your caregiver for further exams, therapy, or testing as directed.  SEEK MEDICAL CARE IF:   Your pain or swelling continues or worsens after 1 week.   You are feeling unwell or have chills.   You have increasing difficulty with walking.   You have a loss of sensation or other new symptoms.   You have questions or concerns.  SEEK IMMEDIATE MEDICAL CARE IF:   You cannot put weight on the affected hip.   You have fallen.   You have a sudden increase in pain and swelling in your hip.   You have a fever.  MAKE SURE YOU:   Understand these instructions.   Will watch your condition.   Will get help right away if you are not doing well or get worse.  Document Released: 01/03/2010 Document Revised: 07/05/2011 Document  Reviewed: 01/03/2010 [facility] Patient Information 2012 [place], [place].

## 2011-11-27 NOTE — ED Provider Notes (Signed)
History     CSN: [id number]  Arrival date & time 11/27/11  2130   First MD Initiated Contact with Patient 11/27/11 0400      Chief Complaint  Patient presents with  . Panic Attack  . Hip Pain    (Consider location/radiation/quality/duration/timing/severity/associated sxs/prior treatment) HPI  Pt has a history of anxiety and panick attacks. She presents to the ED with complaints of right hip pain and anxiety.   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 She denies any new injury or falls. Denies IV drug use or fevers, chills. She states this is just like her pain exacerbation that she gets in her joints sometimes as she has sjogren's dz. She admits to having other joints hurting as well. She see's [facility] for her care.   Anxiety: The patient states that she could not sleep because of her pain. When she can't sleep she gets severe anxiety. She feels as though bugs are crawling through her skin. She denies CP, SOB, nausea, vomiting. She is here with a family member who states that patient is acting at baseline.  Past Medical History  Diagnosis Date  . Fibromyalgia   . Sjogren's disease   . Raynaud's disease   . Osteoarthritis   . AC (acromioclavicular) joint bone spurs     Past Surgical History  Procedure Date  . C sections   . Cholecystectomy   . Endometrial ablation   . Lithotripsy     Family History  Problem Relation Age of Onset  . Hypertension Other   . Diabetes Other   . Cancer Other     History  Substance Use Topics  . Smoking status: Current Everyday Smoker    Types: Cigarettes  . Smokeless tobacco: Not on file  . Alcohol Use: No    OB History    Grav Para Term Preterm Abortions TAB SAB Ect Mult Living                  Review of Systems   HEENT: denies blurry vision or change in hearing PULMONARY: Denies difficulty breathing and SOB CARDIAC: denies chest pain or  heart palpitations MUSCULOSKELETAL:  denies being unable to ambulate ABDOMEN AL: denies abdominal pain GU: denies loss of bowel or urinary control NEURO: denies numbness and tingling in extremities   Allergies  Review of patient's allergies indicates no known allergies.  Home Medications   Current Outpatient Rx  Name Route Sig Dispense Refill  . AMPHETAMINE-DEXTROAMPHETAMINE 20 MG PO TABS Oral Take 20 mg by mouth daily.    Marland Kitchen CLONAZEPAM 1 MG PO TABS Oral Take 1 mg by mouth at bedtime as needed.    . CYCLOBENZAPRINE HCL 10 MG PO TABS Oral Take 10 mg by mouth 3 (three) times daily as needed.    . OXYCODONE-ACETAMINOPHEN 5-325 MG PO TABS Oral Take 1 tablet by mouth every 6 (six) hours as needed for pain. 15 tablet 0    BP 126/66  Pulse 111  Temp(Src) 97.6 F (36.4 C) (Oral)  Resp 20  SpO2 100%  Physical Exam  Nursing note and vitals reviewed. Constitutional: She appears well-developed and well-nourished. No distress.  HENT:  Head: Normocephalic and atraumatic.  Eyes: Pupils are equal, round, and reactive to light.  Neck: Normal range of motion. Neck supple.  Cardiovascular: Normal rate and regular rhythm.   Pulmonary/Chest: Effort normal.  Abdominal: Soft.  Musculoskeletal:  Right hip: She exhibits tenderness. She exhibits normal range of motion, normal strength (she denies tenderness to palpation but admits to pain with motion), no bony tenderness, no swelling, no crepitus, no deformity and no laceration.        Equal strength to bilateral lower extremities. Neurosensory function adequate to both legs. Skin color is normal. Skin is warm and moist. I see no step off deformity, no bony tenderness. Pt is able to ambulate without limp. ROM is decreased due to pain. No crepitus, laceration, effusion, swelling.  Pulses are normal   Neurological: She is alert.  Skin: Skin is warm and dry.  Psychiatric: Her speech is normal. Thought content normal. Her mood appears anxious. She  expresses no homicidal and no suicidal ideation. She expresses no suicidal plans and no homicidal plans.    ED Course  Procedures (including critical care time)  Labs Reviewed - No data to display No results found.   1. Anxiety   2. Hip pain       MDM  Pt given Ativan 2mg  PO and an injection of 30mg  IV Toradol. She states that it did help with her anxiety some but it did not help her pain at all. I gave patient 1 mg IV Dilaudid and the patient admits to her pain resolving from a 10/10 to a 1/10. Patient has been urged to follow-up with [facility] as soon as possible for her pain symptoms. She does not have any pain medications at home and would like some.  Patient with hip pain. No neurological deficits. Patient is ambulatory. No warning symptoms of hip pain including: loss of bowel or bladder control, night sweats, waking from sleep with back pain, unexplained fevers or weight loss, h/o cancer, IVDU, recent trauma. No concern for cauda equina, epidural abscess, or other serious cause of back pain. Conservative measures such as rest, ice/heat and pain medicine indicated.    Pt has been advised of the symptoms that warrant their return to the ED. Patient has voiced understanding and has agreed to follow-up with the PCP or specialist.           Dorthula Matas, PA 11/27/11 [phone]

## 2011-11-27 NOTE — ED Notes (Signed)
Pt states her SI joint is hurting and she is feeling anxious and panicky  Pt states it started around 2pm yesterday afternoon  Pt states she has taken medication at home but is unable to get any relief and is unable to sleep

## 2011-11-27 NOTE — ED Provider Notes (Signed)
Medical screening examination/treatment/procedure(s) were performed by non-physician practitioner and as supervising physician I was immediately available for consultation/collaboration.   Lyanne Co, MD 11/27/11 [phone]

## 2012-02-21 ENCOUNTER — Encounter (HOSPITAL_COMMUNITY): Payer: Self-pay | Admitting: *Deleted

## 2012-02-21 ENCOUNTER — Emergency Department (HOSPITAL_COMMUNITY)
Admission: EM | Admit: 2012-02-21 | Discharge: 2012-02-21 | Disposition: A | Discharge status: Home / Self Care | Payer: BC Managed Care – PPO | Attending: Emergency Medicine | Admitting: Emergency Medicine

## 2012-02-21 ENCOUNTER — Emergency Department (HOSPITAL_COMMUNITY): Payer: BC Managed Care – PPO

## 2012-02-21 DIAGNOSIS — R079 Chest pain, unspecified: Secondary | ICD-10-CM | POA: Insufficient documentation

## 2012-02-21 DIAGNOSIS — F19939 Other psychoactive substance use, unspecified with withdrawal, unspecified: Secondary | ICD-10-CM | POA: Insufficient documentation

## 2012-02-21 DIAGNOSIS — F1123 Opioid dependence with withdrawal: Secondary | ICD-10-CM

## 2012-02-21 DIAGNOSIS — IMO0001 Reserved for inherently not codable concepts without codable children: Secondary | ICD-10-CM | POA: Insufficient documentation

## 2012-02-21 DIAGNOSIS — F17200 Nicotine dependence, unspecified, uncomplicated: Secondary | ICD-10-CM | POA: Insufficient documentation

## 2012-02-21 DIAGNOSIS — F1120 Opioid dependence, uncomplicated: Secondary | ICD-10-CM | POA: Insufficient documentation

## 2012-02-21 DIAGNOSIS — Z9089 Acquired absence of other organs: Secondary | ICD-10-CM | POA: Insufficient documentation

## 2012-02-21 LAB — URINALYSIS, ROUTINE W REFLEX MICROSCOPIC
Bilirubin Urine: NEGATIVE
Glucose, UA: NEGATIVE mg/dL
Hgb urine dipstick: NEGATIVE
Specific Gravity, Urine: 1.015 (ref 1.005–1.030)
pH: 7.5 (ref 5.0–8.0)

## 2012-02-21 LAB — BASIC METABOLIC PANEL
BUN: 14 mg/dL (ref 6–23)
Creatinine, Ser: 0.57 mg/dL (ref 0.50–1.10)
GFR calc Af Amer: >90 mL/min (ref >90)
GFR calc non Af Amer: >90 mL/min (ref >90)
Potassium: 3.7 mEq/L (ref 3.5–5.1)

## 2012-02-21 LAB — CBC WITH DIFFERENTIAL/PLATELET
Basophils Absolute: 0 10*3/uL (ref 0.0–0.1)
Basophils Relative: 0 % (ref 0–1)
Eosinophils Absolute: 0 10*3/uL (ref 0.0–0.7)
Hemoglobin: 13.1 g/dL (ref 12.0–15.0)
MCH: 30.7 pg (ref 26.0–34.0)
MCHC: 35.1 g/dL (ref 30.0–36.0)
Monocytes Relative: 4 % (ref 3–12)
Neutro Abs: 6.3 10*3/uL (ref 1.7–7.7)
Neutrophils Relative %: 75 % (ref 43–77)
Platelets: 273 10*3/uL (ref 150–400)
RDW: 12.9 % (ref 11.5–15.5)

## 2012-02-21 LAB — RAPID URINE DRUG SCREEN, HOSP PERFORMED
Amphetamines: NOT DETECTED
Barbiturates: NOT DETECTED
Benzodiazepines: NOT DETECTED
Cocaine: NOT DETECTED
Opiates: POSITIVE — AB
Tetrahydrocannabinol: POSITIVE — AB

## 2012-02-21 MED ORDER — LORAZEPAM 1 MG PO TABS
ORAL_TABLET | ORAL | Status: AC
  Administered 2012-02-21: 20:00:00
  Filled 2012-02-21: qty 1

## 2012-02-21 MED ORDER — ONDANSETRON 8 MG PO TBDP
ORAL_TABLET | ORAL | Status: AC
  Administered 2012-02-21: 20:00:00
  Filled 2012-02-21: qty 1

## 2012-02-21 MED ORDER — ONDANSETRON 8 MG PO TBDP: 8.0000 mg | ORAL_TABLET | Freq: Once | ORAL | Status: AC

## 2012-02-21 MED ORDER — LORAZEPAM 1 MG PO TABS: 1.0000 mg | ORAL_TABLET | Freq: Once | ORAL | Status: AC

## 2012-02-21 MED ORDER — MORPHINE SULFATE 4 MG/ML IJ SOLN
4.0000 mg | Freq: Once | INTRAMUSCULAR | Status: AC
  Administered 2012-02-21: 4 mg via INTRAVENOUS
  Filled 2012-02-21: qty 1

## 2012-02-21 NOTE — ED Notes (Signed)
edpa notified no one has signed up for the patients and pt cont's to take of cardiac monitor and bp cuff

## 2012-02-21 NOTE — ED Notes (Signed)
Pt states she " I stop taking my pain medication and I feel weird". Pt states she is having generalized pain. Pt states she has not had any sleep and c/o nausea but no emesis. Pt is very anxious pt state she does not know if she is having an anxiety attack

## 2012-02-21 NOTE — ED Notes (Signed)
Pt states she feels like some thing is crawling on her skin. Pt states she feels achy all over. Pt educated on narcotic withdrawal

## 2012-02-22 NOTE — ED Provider Notes (Signed)
History     CSN: [id number]  Arrival date & time 02/21/12  1634   First MD Initiated Contact with Patient 02/21/12 1824      Chief Complaint  Patient presents with  . Chest Pain    (Consider location/radiation/quality/duration/timing/severity/associated sxs/prior treatment) HPI Patient is a 41 yo female who presents today complaining of 10/10 chest pain as well as nausea, vomiting, diarrhea, and body aches.  She denies cough, fever, sick contacts.  She has no history of CAD, PE, DVT, HTN, HLD, DM, or family history of early CAD.  Patient has no variation in pain with inspiration.  Patient reports that within the past 24 hours she has stopped taking prescription opiates "cold [place]".  Patient has history of fibromyalgia and reports being on chronic opiates for this as well as other medical problems.  She has an appointment tomorrow with an addiction specialist to start on suboxone.  Patient was previously taking 120 mg of oxycodone a day or more.  There are no other associated or modifying factors.  Past Medical History  Diagnosis Date  . Fibromyalgia   . Sjogren's disease   . Raynaud's disease   . Osteoarthritis   . AC (acromioclavicular) joint bone spurs     Past Surgical History  Procedure Date  . C sections   . Cholecystectomy   . Endometrial ablation   . Lithotripsy     Family History  Problem Relation Age of Onset  . Hypertension Other   . Diabetes Other   . Cancer Other     History  Substance Use Topics  . Smoking status: Current Everyday Smoker    Types: Cigarettes  . Smokeless tobacco: Not on file  . Alcohol Use: No    OB History    Grav Para Term Preterm Abortions TAB SAB Ect Mult Living                  Review of Systems  Constitutional: Negative.   HENT: Negative.   Eyes: Negative.   Respiratory: Negative.   Cardiovascular: Positive for chest pain.  Gastrointestinal: Positive for nausea, vomiting, abdominal pain and diarrhea.  Genitourinary:  Negative.   Musculoskeletal: Negative.   Skin: Negative.   Neurological: Negative.   Hematological: Negative.   Psychiatric/Behavioral: Negative.   All other systems reviewed and are negative.    Allergies  Review of patient's allergies indicates no known allergies.  Home Medications   Current Outpatient Rx  Name Route Sig Dispense Refill  . OXYCODONE HCL 30 MG PO TABS Oral Take 30-60 mg by mouth every 8 (eight) hours as needed. For pain.    . OXYCODONE HCL ER 60 MG PO TB12 Oral Take 1 tablet by mouth every 8 (eight) hours.      BP 122/69  Pulse 86  Temp 97.8 F (36.6 C) (Oral)  Resp 24  SpO2 96%  Physical Exam  Nursing note and vitals reviewed. GEN: Well-developed, well-nourished female in mild distress, very uncomfortable appearing HEENT: Atraumatic, normocephalic. Oropharynx clear without erythema EYES: PERRLA BL, no scleral icterus. NECK: Trachea midline, no meningismus CV: regular rate and rhythm. No murmurs, rubs, or gallops PULM: No respiratory distress.  No crackles, wheezes, or rales. GI: soft, non-tender. No guarding, rebound, or tenderness. + bowel sounds  GU: deferred Neuro: cranial nerves grossly 2-12 intact, no abnormalities of strength or sensation, A and O x 3 MSK: Patient moves all 4 extremities symmetrically, no deformity, edema, or injury noted Skin: No rashes petechiae, purpura, or jaundice  Psych: tearful, denies SI, HI, hallucinations.  Endorses long term opiate use but denies wanting inpatient therapy for this.   ED Course  Procedures (including critical care time)  Indication: chest pain Please note this EKG was reviewed extemporaneously by myself.  Date: 02/21/2012  Rate: 103  Rhythm: sinus tachycardia  QRS Axis: normal  Intervals: normal  ST/T Wave abnormalities: normal  Conduction Disutrbances:none  Narrative Interpretation:   Old EKG Reviewed: unchanged     Labs Reviewed  URINALYSIS, ROUTINE W REFLEX MICROSCOPIC - Abnormal;  Notable for the following:    APPearance CLOUDY (*)     All other components within normal limits  URINE RAPID DRUG SCREEN (HOSP PERFORMED) - Abnormal; Notable for the following:    Opiates POSITIVE (*)     Tetrahydrocannabinol POSITIVE (*)     All other components within normal limits  CBC WITH DIFFERENTIAL  BASIC METABOLIC PANEL   Dg Chest 2 View  02/21/2012  *RADIOLOGY REPORT*  Clinical Data: Chest pain  CHEST - 2 VIEW  Comparison: 05/19/2008  Findings: Decreased lung volumes versus previous exam. Enlargement of cardiac silhouette. Mediastinal contours and pulmonary vascularity normal. Right basilar atelectasis. Lungs otherwise clear. No pleural effusion or pneumothorax. No acute osseous findings.  IMPRESSION: Enlargement of cardiac silhouette. Decreased lung volumes with right basilar atelectasis.  Original Report Authenticated By: Lollie Marrow, M.D.     1. Opiate withdrawal       MDM  Patient was evaluated by myself and was complaining of generalized aches as well as nausea, vomiting, and diarrhea.  Though she felt the pain was focused in her chest she did agree with me that symptoms were likely due to her immediate cessation of all opiate medications.  Patient was not suicidal, homicidal, or hallucinating.  She denied wanting inpatient treatment.  Patient was given a single dose of pain medication .  She was not discharged with any meds and has an appointment with a physician for addiction tomorrow.  Mother was at bedside and was comfortable taking the patient home.  She was discharged in good condition.        Cyndra Numbers, MD 02/22/12 2202

## 2013-06-11 ENCOUNTER — Emergency Department (HOSPITAL_BASED_OUTPATIENT_CLINIC_OR_DEPARTMENT_OTHER): Payer: BC Managed Care – PPO

## 2013-06-11 ENCOUNTER — Emergency Department (HOSPITAL_BASED_OUTPATIENT_CLINIC_OR_DEPARTMENT_OTHER)
Admission: EM | Admit: 2013-06-11 | Discharge: 2013-06-11 | Disposition: A | Discharge status: Home / Self Care | Payer: BC Managed Care – PPO | Attending: Emergency Medicine | Admitting: Emergency Medicine

## 2013-06-11 ENCOUNTER — Encounter (HOSPITAL_BASED_OUTPATIENT_CLINIC_OR_DEPARTMENT_OTHER): Payer: Self-pay | Admitting: Emergency Medicine

## 2013-06-11 DIAGNOSIS — F17200 Nicotine dependence, unspecified, uncomplicated: Secondary | ICD-10-CM | POA: Insufficient documentation

## 2013-06-11 DIAGNOSIS — M545 Low back pain, unspecified: Secondary | ICD-10-CM | POA: Insufficient documentation

## 2013-06-11 DIAGNOSIS — N200 Calculus of kidney: Secondary | ICD-10-CM | POA: Insufficient documentation

## 2013-06-11 DIAGNOSIS — Z8679 Personal history of other diseases of the circulatory system: Secondary | ICD-10-CM | POA: Insufficient documentation

## 2013-06-11 DIAGNOSIS — G8929 Other chronic pain: Secondary | ICD-10-CM | POA: Insufficient documentation

## 2013-06-11 DIAGNOSIS — F411 Generalized anxiety disorder: Secondary | ICD-10-CM | POA: Insufficient documentation

## 2013-06-11 DIAGNOSIS — Z8739 Personal history of other diseases of the musculoskeletal system and connective tissue: Secondary | ICD-10-CM | POA: Insufficient documentation

## 2013-06-11 DIAGNOSIS — R6883 Chills (without fever): Secondary | ICD-10-CM | POA: Insufficient documentation

## 2013-06-11 LAB — URINALYSIS, ROUTINE W REFLEX MICROSCOPIC
Bilirubin Urine: NEGATIVE
Ketones, ur: NEGATIVE mg/dL
Nitrite: NEGATIVE
Specific Gravity, Urine: 1.018 (ref 1.005–1.030)
Urobilinogen, UA: 0.2 mg/dL (ref 0.0–1.0)

## 2013-06-11 LAB — URINE MICROSCOPIC-ADD ON

## 2013-06-11 MED ORDER — KETOROLAC TROMETHAMINE 60 MG/2ML IM SOLN: 60.0000 mg | Freq: Once | INTRAMUSCULAR | Status: DC

## 2013-06-11 MED ORDER — KETOROLAC TROMETHAMINE 30 MG/ML IJ SOLN
30.0000 mg | Freq: Once | INTRAMUSCULAR | Status: AC
  Administered 2013-06-11: 30 mg via INTRAVENOUS
  Filled 2013-06-11: qty 1

## 2013-06-11 MED ORDER — KETOROLAC TROMETHAMINE 10 MG PO TABS: 10.0000 mg | ORAL_TABLET | Freq: Four times a day (QID) | ORAL | Status: DC | PRN

## 2013-06-11 NOTE — ED Provider Notes (Signed)
CSN: [id number]     Arrival date & time 06/11/13  4098 History   First MD Initiated Contact with Patient 06/11/13 1000     Chief Complaint  Patient presents with  . Flank Pain   (Consider location/radiation/quality/duration/timing/severity/associated sxs/prior Treatment) Patient is a 42 y.o. female presenting with flank pain.  Flank Pain Associated symptoms include chills. Pertinent negatives include no abdominal pain, chest pain, coughing, fever, headaches or sore throat.    42 year old female here with one day of left flank pain, and chills. She states her pain is 7/10 intermittent ripping pain radiating to her left groin. She denies blood in her urine and is only having slight dysuria. She had some slight chills this morning. She states that she had stones in the 90s that required lithotripsy and states she had stones again about one year ago that did not need any treatment. She has been trying to quit narcotics since July of this year, and is now on 10 mg daily at Suboxone. She does not want narcotics for this pain.  Her chronic back pain is described as low mid back pain radiating down both legs and at its usual intensity. She denies bowel or bladder dysfunction, saddle anesthesia, or weakness or numbness of her lower extremities.   Past Medical History  Diagnosis Date  . Fibromyalgia   . Sjogren's disease   . Raynaud's disease   . Osteoarthritis   . AC (acromioclavicular) joint bone spurs    Past Surgical History  Procedure Laterality Date  . C sections    . Cholecystectomy    . Endometrial ablation    . Lithotripsy     Family History  Problem Relation Age of Onset  . Hypertension Other   . Diabetes Other   . Cancer Other    History  Substance Use Topics  . Smoking status: Current Every Day Smoker    Types: Cigarettes  . Smokeless tobacco: Not on file  . Alcohol Use: No   OB History   Grav Para Term Preterm Abortions TAB SAB Ect Mult Living                  Review of Systems  Constitutional: Positive for chills. Negative for fever and appetite change.  HENT: Negative for sore throat.   Respiratory: Negative for cough and shortness of breath.   Cardiovascular: Negative for chest pain.  Gastrointestinal: Negative for abdominal pain.  Genitourinary: Positive for dysuria and flank pain.  Musculoskeletal: Positive for back pain.  Neurological: Negative for headaches.  Psychiatric/Behavioral: The patient is nervous/anxious.   All other systems reviewed and are negative.    Allergies  Review of patient's allergies indicates no known allergies.  Home Medications   Current Outpatient Rx  Name  Route  Sig  Dispense  Refill  . buprenorphine-naloxone (SUBOXONE) 8-2 MG SUBL SL tablet   Sublingual   Place 1 tablet under the tongue daily.         Marland Kitchen ketorolac (TORADOL) 10 MG tablet   Oral   Take 1 tablet (10 mg total) by mouth every 6 (six) hours as needed for moderate pain.   20 tablet   0   . oxycodone (ROXICODONE) 30 MG immediate release tablet   Oral   Take 30-60 mg by mouth every 8 (eight) hours as needed. For pain.         . Oxycodone HCl (OXYCONTIN) 60 MG TB12   Oral   Take 1 tablet by mouth every 8 (eight)  hours.          BP 136/71  Pulse 104  Temp(Src) 98.4 F (36.9 C) (Oral)  Resp 16  Ht 5' 2.25" (1.581 m)  Wt 115 lb (52.164 kg)  BMI 20.87 kg/m2  SpO2 100% Physical Exam  Constitutional: She is oriented to person, place, and time. She appears well-developed and well-nourished. No distress.  HENT:  Head: Normocephalic and atraumatic.  Eyes: EOM are normal. Pupils are equal, round, and reactive to light.  Neck: Neck supple.  Cardiovascular: Normal rate, regular rhythm and normal heart sounds.   No murmur heard. Pulmonary/Chest: Effort normal and breath sounds normal.  Abdominal: Soft. Bowel sounds are normal. There is tenderness in the left upper quadrant and left lower quadrant. There is CVA tenderness. There  is no guarding.  Musculoskeletal: She exhibits no edema.  Neurological: She is alert and oriented to person, place, and time. She has normal strength. She displays no atrophy and no tremor. She exhibits normal muscle tone.  Skin: Skin is warm and dry. She is not diaphoretic.  Psychiatric: She has a normal mood and affect.    ED Course  Procedures (including critical care time) Labs Review Labs Reviewed  URINALYSIS, ROUTINE W REFLEX MICROSCOPIC - Abnormal; Notable for the following:    APPearance CLOUDY (*)    Hgb urine dipstick LARGE (*)    Leukocytes, UA SMALL (*)    All other components within normal limits  URINE MICROSCOPIC-ADD ON - Abnormal; Notable for the following:    Bacteria, UA MANY (*)    All other components within normal limits  URINE CULTURE   Imaging Review Ct Abdomen Pelvis Wo Contrast  06/11/2013   CLINICAL DATA:  Left flank pain.  EXAM: CT ABDOMEN AND PELVIS WITHOUT CONTRAST  TECHNIQUE: Multidetector CT imaging of the abdomen and pelvis was performed following the standard protocol without intravenous contrast.  COMPARISON:  CT scan of February 06, 2007.  FINDINGS: Visualized lung bases appear normal. Status post cholecystectomy. These unenhanced images demonstrate no focal abnormality of on the liver, spleen or pancreas. Adrenal glands appear normal. Bilateral nephrolithiasis is noted. There is noted a 7 mm calculus at the right ureteropelvic junction which does not appear to be resulting in significant hydronephrosis. The appendix appears normal. There is no evidence of bowel obstruction. Urinary bladder and uterus appear normal. 3 cm right ovarian cyst is noted. No abnormal fluid collection is noted.  IMPRESSION: Bilateral nephrolithiasis is noted. 7 mm calculus is noted at the right ureteropelvic junction which does not result in significant hydronephrosis. 3 cm right ovarian cyst is noted.   Electronically Signed   By: Roque Lias M.D.   On: 06/11/2013 11:02    EKG  Interpretation   None       MDM   1. Renal stone     42 year old female here with left flank pain concerning for nephrolithiasis. Considering that she is trying to get off opiates and would not like any opiates for this we'll treat with Toradol and proceed with CT abdomen pelvis without.  We'll also perform UA to eval for UTI. CT scan abdomen and pelvis shows 7 mm stone on the right side at the UPJ, renal stone on the left has not moved to the ureter. No sign of hydronephrosis. Discussed with the patient that right-sided stone is likely not causing left-sided pain however she will need urology followup as a 7 mm stone is unlikely that self resolve.   Contact information was given  for [facility] urology, and a referral was written.  Patient was given 5 day prescription of Toradol, as she does not want to use opiate medications.  Red flags were discussed, return for worsening symptoms.  Murtis Sink, MD [facility] Resident, PGY-2 06/11/2013, 3:15 PM     Elenora Gamma, MD 06/11/13 [phone]

## 2013-06-11 NOTE — ED Notes (Signed)
Pt amb to room 2 with quick steady gait in nad. Pt reports her usual renal stone pain to left flank area radiating to left lq x yesterday. Pt denies any hematuria, dysuria or frequency. Denies any fevers. Rates pain at 7/10. Pt states she is on saboxone, and does not want narcotics for her pain.

## 2013-06-12 ENCOUNTER — Encounter: Payer: Self-pay | Admitting: Internal Medicine

## 2013-06-12 LAB — URINE CULTURE: Culture: NO GROWTH

## 2013-06-12 NOTE — ED Provider Notes (Signed)
I saw and evaluated the patient, reviewed the resident's note and I agree with the findings and plan. Patient is a 42 year old female who presents to the emergency department with complaints of pain in the left flank. This feels similar to prior kidney stones. She denies any fevers or chills. She denies any hematuria. She is currently in treatment for opioid addiction and is being treated with Suboxone. She is requesting no narcotic medications be given.  On exam her vitals are stable and the patient is afebrile. Head is atraumatic normocephalic. Heart is regular rate rhythm lungs are clear. The abdomen is soft. There is tenderness to palpation in the left flank and left lower quadrant without rebound or guarding.  Workup reveals hematuria on the UA and CT scan shows a 7 mm stone in the right UPJ. Her pain is in the left side however there is no evidence of left-sided pathology on the CT scan. She will be treated with Toradol as she is requesting no narcotics be given. She is to follow up with urology if not improving in the next 2-3 days.     Geoffery Lyons, MD 06/12/13 [phone]

## 2014-07-10 ENCOUNTER — Encounter (HOSPITAL_BASED_OUTPATIENT_CLINIC_OR_DEPARTMENT_OTHER): Payer: Self-pay | Admitting: *Deleted

## 2014-07-10 ENCOUNTER — Emergency Department (HOSPITAL_BASED_OUTPATIENT_CLINIC_OR_DEPARTMENT_OTHER)
Admission: EM | Admit: 2014-07-10 | Discharge: 2014-07-10 | Disposition: A | Discharge status: Home / Self Care | Payer: BC Managed Care – PPO | Attending: Emergency Medicine | Admitting: Emergency Medicine

## 2014-07-10 DIAGNOSIS — Z8679 Personal history of other diseases of the circulatory system: Secondary | ICD-10-CM | POA: Insufficient documentation

## 2014-07-10 DIAGNOSIS — M1990 Unspecified osteoarthritis, unspecified site: Secondary | ICD-10-CM | POA: Insufficient documentation

## 2014-07-10 DIAGNOSIS — K047 Periapical abscess without sinus: Secondary | ICD-10-CM | POA: Insufficient documentation

## 2014-07-10 DIAGNOSIS — Z720 Tobacco use: Secondary | ICD-10-CM | POA: Insufficient documentation

## 2014-07-10 DIAGNOSIS — Z79891 Long term (current) use of opiate analgesic: Secondary | ICD-10-CM | POA: Insufficient documentation

## 2014-07-10 DIAGNOSIS — M797 Fibromyalgia: Secondary | ICD-10-CM | POA: Insufficient documentation

## 2014-07-10 DIAGNOSIS — Z79899 Other long term (current) drug therapy: Secondary | ICD-10-CM | POA: Insufficient documentation

## 2014-07-10 MED ORDER — HYDROCODONE-ACETAMINOPHEN 5-325 MG PO TABS: 1.0000 | ORAL_TABLET | ORAL | Status: DC | PRN

## 2014-07-10 MED ORDER — AMOXICILLIN 500 MG PO CAPS: 500.0000 mg | ORAL_CAPSULE | Freq: Three times a day (TID) | ORAL | Status: DC

## 2014-07-10 MED ORDER — OXYCODONE-ACETAMINOPHEN 5-325 MG PO TABS
2.0000 | ORAL_TABLET | Freq: Once | ORAL | Status: AC
  Administered 2014-07-10: 2 via ORAL
  Filled 2014-07-10: qty 2

## 2014-07-10 NOTE — ED Notes (Signed)
Patient has left facial swelling and pain.

## 2014-07-10 NOTE — Discharge Instructions (Signed)
Take Vicodin for severe pain only. No driving or operating heavy machinery while taking vicodin. This medication may cause drowsiness. Take antibiotic to completion. Follow up with your dentist.  Facial Infection You have an infection of your face. This requires special attention to help prevent serious problems. Infections in facial wounds can cause poor healing and scars. They can also spread to deeper tissues, especially around the eye. Wound and dental infections can lead to sinusitis, infection of the eye socket, and even meningitis. Permanent damage to the skin, eye, and nervous system may result if facial infections are not treated properly. With severe infections, hospital care for IV antibiotic injections may be needed if they don't respond to oral antibiotics. Antibiotics must be taken for the full course to insure the infection is eliminated. If the infection came from a bad tooth, it may have to be extracted when the infection is under control. Warm compresses may be applied to reduce skin irritation and remove drainage. You might need a tetanus shot now if:  You cannot remember when your last tetanus shot was.  You have never had a tetanus shot.  The object that caused your wound was dirty. If you need a tetanus shot, and you decide not to get one, there is a rare chance of getting tetanus. Sickness from tetanus can be serious. If you got a tetanus shot, your arm may swell, get red and warm to the touch at the shot site. This is common and not a problem. SEEK IMMEDIATE MEDICAL CARE IF:   You have increased swelling, redness, or trouble breathing.  You have a severe headache, dizziness, nausea, or vomiting.  You develop problems with your eyesight.  You have a fever. Document Released: 08/23/2004 Document Revised: 10/08/2011 Document Reviewed: 07/16/2005 [facility] Patient Information 2015 [place], [place]. This information is not intended to replace advice given to you by your  health care provider. Make sure you discuss any questions you have with your health care provider. Dental Pain A tooth ache may be caused by cavities (tooth decay). Cavities expose the nerve of the tooth to air and hot or cold temperatures. It may come from an infection or abscess (also called a boil or furuncle) around your tooth. It is also often caused by dental caries (tooth decay). This causes the pain you are having. DIAGNOSIS  Your caregiver can diagnose this problem by exam. TREATMENT   If caused by an infection, it may be treated with medications which kill germs (antibiotics) and pain medications as prescribed by your caregiver. Take medications as directed.  Only take over-the-counter or prescription medicines for pain, discomfort, or fever as directed by your caregiver.  Whether the tooth ache today is caused by infection or dental disease, you should see your dentist as soon as possible for further care. SEEK MEDICAL CARE IF: The exam and treatment you received today has been provided on an emergency basis only. This is not a substitute for complete medical or dental care. If your problem worsens or new problems (symptoms) appear, and you are unable to meet with your dentist, call or return to this location. SEEK IMMEDIATE MEDICAL CARE IF:   You have a fever.  You develop redness and swelling of your face, jaw, or neck.  You are unable to open your mouth.  You have severe pain uncontrolled by pain medicine. MAKE SURE YOU:   Understand these instructions.  Will watch your condition.  Will get help right away if you are not doing  well or get worse. Document Released: 07/16/2005 Document Revised: 10/08/2011 Document Reviewed: 03/03/2008 [facility] Patient Information 2015 [place], [place]. This information is not intended to replace advice given to you by your health care provider. Make sure you discuss any questions you have with your health care provider.

## 2014-07-10 NOTE — ED Provider Notes (Signed)
CSN: [id number]     Arrival date & time 07/10/14  1605 History   First MD Initiated Contact with Patient 07/10/14 1615     Chief Complaint  Patient presents with  . Facial Swelling     (Consider location/radiation/quality/duration/timing/severity/associated sxs/prior Treatment) HPI Comments: 43 y/o female presenting with left-sided facial swelling beginning yesterday evening with associated left-sided dental pain. Patient states she may have some chipped teeth in that area along with a broken crown. She was unable to see her dentist. States the pain is sharp, constant, worse with chewing, unrelieved by ibuprofen or Tylenol. She does not believe she's had any fevers. Initially she thought she had a sinus infection as she's had cold symptoms over the past week, relieved by using a Neti-Pot.  The history is provided by the patient.    Past Medical History  Diagnosis Date  . Fibromyalgia   . Sjogren's disease   . Raynaud's disease   . Osteoarthritis   . AC (acromioclavicular) joint bone spurs    Past Surgical History  Procedure Laterality Date  . C sections    . Cholecystectomy    . Endometrial ablation    . Lithotripsy     Family History  Problem Relation Age of Onset  . Hypertension Other   . Diabetes Other   . Cancer Other    History  Substance Use Topics  . Smoking status: Current Every Day Smoker    Types: Cigarettes  . Smokeless tobacco: Not on file  . Alcohol Use: No   OB History    No data available     Review of Systems  10 Systems reviewed and are negative for acute change except as noted in the HPI.  Allergies  Review of patient's allergies indicates no known allergies.  Home Medications   Prior to Admission medications   Medication Sig Start Date End Date Taking? Authorizing Provider  amoxicillin (AMOXIL) 500 MG capsule Take 1 capsule (500 mg total) by mouth 3 (three) times daily. 07/10/14   Skylier Kretschmer M Bentlee Drier, PA-C  buprenorphine-naloxone (SUBOXONE) 8-2  MG SUBL SL tablet Place 1 tablet under the tongue daily.    Historical Provider, MD  HYDROcodone-acetaminophen (NORCO/VICODIN) 5-325 MG per tablet Take 1-2 tablets by mouth every 4 (four) hours as needed. 07/10/14   Kathrynn Speed, PA-C  ketorolac (TORADOL) 10 MG tablet Take 1 tablet (10 mg total) by mouth every 6 (six) hours as needed for moderate pain. 06/11/13   Elenora Gamma, MD  oxycodone (ROXICODONE) 30 MG immediate release tablet Take 30-60 mg by mouth every 8 (eight) hours as needed. For pain.    Historical Provider, MD  Oxycodone HCl (OXYCONTIN) 60 MG TB12 Take 1 tablet by mouth every 8 (eight) hours.    Historical Provider, MD   BP 127/78 mmHg  Pulse 81  Temp(Src) 98.4 F (36.9 C) (Oral)  Resp 18  SpO2 100% Physical Exam  Constitutional: She is oriented to person, place, and time. She appears well-developed and well-nourished. No distress.  HENT:  Head: Normocephalic and atraumatic.  Mouth/Throat: Uvula is midline and oropharynx is clear and moist.    Mild left sided swelling over maxilla. Tenderness over left upper first, second and third molars with surrounding erythema and edema. No dental abscess. Swallows secretions well.  Eyes: Conjunctivae and EOM are normal.  Neck: Normal range of motion. Neck supple.  Cardiovascular: Normal rate, regular rhythm and normal heart sounds.   Pulmonary/Chest: Effort normal and breath sounds normal. No respiratory  distress.  Musculoskeletal: Normal range of motion. She exhibits no edema.  Lymphadenopathy:    She has no cervical adenopathy.  Neurological: She is alert and oriented to person, place, and time. No sensory deficit.  Skin: Skin is warm and dry.  Psychiatric: She has a normal mood and affect. Her behavior is normal.  Nursing note and vitals reviewed.   ED Course  Procedures (including critical care time) Labs Review Labs Reviewed - No data to display  Imaging Review No results found.   EKG Interpretation None       MDM   Final diagnoses:  Dental infection    Dental pain associated with dental infection. No evidence of dental abscess. Patient is afebrile, non toxic appearing and swallowing secretions well. Stressed the importance of dental follow up for ultimate management of dental pain. She reports she will call Monday to schedule an appointment. I will also give amoxicillin and pain control. Patient voices understanding and is agreeable to plan. Return precautions given.   Kathrynn Speed, PA-C 07/10/14 1638  Warnell Forester, MD 07/11/14 [phone]

## 2014-11-12 ENCOUNTER — Emergency Department (HOSPITAL_BASED_OUTPATIENT_CLINIC_OR_DEPARTMENT_OTHER): Payer: 59

## 2014-11-12 ENCOUNTER — Encounter (HOSPITAL_BASED_OUTPATIENT_CLINIC_OR_DEPARTMENT_OTHER): Payer: Self-pay | Admitting: Emergency Medicine

## 2014-11-12 ENCOUNTER — Emergency Department (HOSPITAL_BASED_OUTPATIENT_CLINIC_OR_DEPARTMENT_OTHER)
Admission: EM | Admit: 2014-11-12 | Discharge: 2014-11-12 | Disposition: A | Discharge status: Home / Self Care | Payer: 59 | Attending: Emergency Medicine | Admitting: Emergency Medicine

## 2014-11-12 DIAGNOSIS — Z720 Tobacco use: Secondary | ICD-10-CM | POA: Diagnosis not present

## 2014-11-12 DIAGNOSIS — Z8679 Personal history of other diseases of the circulatory system: Secondary | ICD-10-CM | POA: Diagnosis not present

## 2014-11-12 DIAGNOSIS — Z792 Long term (current) use of antibiotics: Secondary | ICD-10-CM | POA: Insufficient documentation

## 2014-11-12 DIAGNOSIS — M069 Rheumatoid arthritis, unspecified: Secondary | ICD-10-CM | POA: Diagnosis not present

## 2014-11-12 DIAGNOSIS — Z3202 Encounter for pregnancy test, result negative: Secondary | ICD-10-CM | POA: Diagnosis not present

## 2014-11-12 DIAGNOSIS — Z8719 Personal history of other diseases of the digestive system: Secondary | ICD-10-CM | POA: Diagnosis not present

## 2014-11-12 DIAGNOSIS — N200 Calculus of kidney: Secondary | ICD-10-CM

## 2014-11-12 DIAGNOSIS — R109 Unspecified abdominal pain: Secondary | ICD-10-CM | POA: Diagnosis present

## 2014-11-12 DIAGNOSIS — N202 Calculus of kidney with calculus of ureter: Secondary | ICD-10-CM | POA: Insufficient documentation

## 2014-11-12 DIAGNOSIS — M1990 Unspecified osteoarthritis, unspecified site: Secondary | ICD-10-CM | POA: Insufficient documentation

## 2014-11-12 HISTORY — DX: Rheumatoid arthritis, unspecified: M06.9

## 2014-11-12 HISTORY — DX: Unspecified ovarian cyst, unspecified side: N83.209

## 2014-11-12 HISTORY — DX: Irritable bowel syndrome, unspecified: K58.9

## 2014-11-12 LAB — CBC WITH DIFFERENTIAL/PLATELET
Basophils Absolute: 0 10*3/uL (ref 0.0–0.1)
Basophils Relative: 0 % (ref 0–1)
EOS ABS: 0.1 10*3/uL (ref 0.0–0.7)
EOS PCT: 1 % (ref 0–5)
HEMATOCRIT: 39.4 % (ref 36.0–46.0)
Hemoglobin: 13.3 g/dL (ref 12.0–15.0)
LYMPHS ABS: 1.5 10*3/uL (ref 0.7–4.0)
LYMPHS PCT: 24 % (ref 12–46)
MCH: 31.4 pg (ref 26.0–34.0)
MCHC: 33.8 g/dL (ref 30.0–36.0)
MCV: 93.1 fL (ref 78.0–100.0)
Monocytes Absolute: 0.5 10*3/uL (ref 0.1–1.0)
Monocytes Relative: 7 % (ref 3–12)
Neutro Abs: 4.3 10*3/uL (ref 1.7–7.7)
Neutrophils Relative %: 68 % (ref 43–77)
Platelets: 231 10*3/uL (ref 150–400)
RBC: 4.23 MIL/uL (ref 3.87–5.11)
RDW: 12.3 % (ref 11.5–15.5)
WBC: 6.4 10*3/uL (ref 4.0–10.5)

## 2014-11-12 LAB — URINE MICROSCOPIC-ADD ON

## 2014-11-12 LAB — URINALYSIS, ROUTINE W REFLEX MICROSCOPIC
BILIRUBIN URINE: NEGATIVE
Glucose, UA: NEGATIVE mg/dL
Ketones, ur: NEGATIVE mg/dL
NITRITE: NEGATIVE
Protein, ur: NEGATIVE mg/dL
Specific Gravity, Urine: 1.01 (ref 1.005–1.030)
UROBILINOGEN UA: 0.2 mg/dL (ref 0.0–1.0)
pH: 6.5 (ref 5.0–8.0)

## 2014-11-12 LAB — PREGNANCY, URINE: Preg Test, Ur: NEGATIVE

## 2014-11-12 MED ORDER — NAPROXEN 500 MG PO TABS
500.0000 mg | ORAL_TABLET | Freq: Two times a day (BID) | ORAL | Status: DC
Start: 2014-11-12 — End: 2015-12-08

## 2014-11-12 MED ORDER — OXYCODONE-ACETAMINOPHEN 5-325 MG PO TABS: 2.0000 | ORAL_TABLET | ORAL | Status: DC | PRN

## 2014-11-12 MED ORDER — ONDANSETRON HCL 4 MG/2ML IJ SOLN
4.0000 mg | Freq: Once | INTRAMUSCULAR | Status: AC
  Administered 2014-11-12: 4 mg via INTRAVENOUS
  Filled 2014-11-12: qty 2

## 2014-11-12 MED ORDER — HYDROMORPHONE HCL 1 MG/ML IJ SOLN
1.0000 mg | Freq: Once | INTRAMUSCULAR | Status: AC
  Administered 2014-11-12: 1 mg via INTRAVENOUS
  Filled 2014-11-12: qty 1

## 2014-11-12 MED ORDER — ONDANSETRON 4 MG PO TBDP: 4.0000 mg | ORAL_TABLET | Freq: Three times a day (TID) | ORAL | Status: DC | PRN

## 2014-11-12 MED ORDER — TAMSULOSIN HCL 0.4 MG PO CAPS
0.4000 mg | ORAL_CAPSULE | Freq: Once | ORAL | Status: AC
  Administered 2014-11-12: 0.4 mg via ORAL
  Filled 2014-11-12: qty 1

## 2014-11-12 MED ORDER — TAMSULOSIN HCL 0.4 MG PO CAPS: 0.4000 mg | ORAL_CAPSULE | Freq: Every day | ORAL | Status: DC

## 2014-11-12 MED ORDER — KETOROLAC TROMETHAMINE 30 MG/ML IJ SOLN
30.0000 mg | Freq: Once | INTRAMUSCULAR | Status: AC
  Administered 2014-11-12: 30 mg via INTRAVENOUS

## 2014-11-12 MED ORDER — KETOROLAC TROMETHAMINE 30 MG/ML IJ SOLN
INTRAMUSCULAR | Status: AC
  Filled 2014-11-12: qty 1

## 2014-11-12 NOTE — ED Notes (Signed)
Patient transported to Ultrasound 

## 2014-11-12 NOTE — ED Provider Notes (Signed)
CSN: [id number]     Arrival date & time 11/12/14  5732 History   First MD Initiated Contact with Patient 11/12/14 [phone]     Chief Complaint  Patient presents with  . Abdominal Pain     HPI  Degeneration of abdominal pain flank pain and back pain. Pain is primarily pelvic in nature. Related to her back. Patient kidney stones in the past and this feels different. Is been present for the last 24 hours. His family worse to attempt to ambulate. No vaginal bleeding or discharge. No dysuria or frequency. No hematuria. No nausea vomiting normal bowel movements.  Past Medical History  Diagnosis Date  . Fibromyalgia   . Sjogren's disease   . Raynaud's disease   . Osteoarthritis   . AC (acromioclavicular) joint bone spurs   . Rheumatoid arthritis   . Ovarian cyst   . IBS (irritable bowel syndrome)    Past Surgical History  Procedure Laterality Date  . C sections    . Cholecystectomy    . Endometrial ablation    . Lithotripsy     Family History  Problem Relation Age of Onset  . Hypertension Other   . Diabetes Other   . Cancer Other    History  Substance Use Topics  . Smoking status: Current Every Day Smoker    Types: Cigarettes  . Smokeless tobacco: Not on file  . Alcohol Use: No   OB History    No data available     Review of Systems  Constitutional: Negative for fever, chills, diaphoresis, appetite change and fatigue.  HENT: Negative for mouth sores, sore throat and trouble swallowing.   Eyes: Negative for visual disturbance.  Respiratory: Negative for cough, chest tightness, shortness of breath and wheezing.   Cardiovascular: Negative for chest pain.  Gastrointestinal: Positive for abdominal pain. Negative for nausea, vomiting, diarrhea and abdominal distention.  Endocrine: Negative for polydipsia, polyphagia and polyuria.  Genitourinary: Positive for pelvic pain. Negative for dysuria, frequency and hematuria.  Musculoskeletal: Negative for gait problem.  Skin: Negative  for color change, pallor and rash.  Neurological: Negative for dizziness, syncope, light-headedness and headaches.  Hematological: Does not bruise/bleed easily.  Psychiatric/Behavioral: Negative for behavioral problems and confusion.      Allergies  Review of patient's allergies indicates no known allergies.  Home Medications   Prior to Admission medications   Medication Sig Start Date End Date Taking? Authorizing Provider  amoxicillin (AMOXIL) 500 MG capsule Take 1 capsule (500 mg total) by mouth 3 (three) times daily. 07/10/14   Robyn M Hess, PA-C  buprenorphine-naloxone (SUBOXONE) 8-2 MG SUBL SL tablet Place 1 tablet under the tongue daily.    Historical Provider, MD  HYDROcodone-acetaminophen (NORCO/VICODIN) 5-325 MG per tablet Take 1-2 tablets by mouth every 4 (four) hours as needed. 07/10/14   Kathrynn Speed, PA-C  ketorolac (TORADOL) 10 MG tablet Take 1 tablet (10 mg total) by mouth every 6 (six) hours as needed for moderate pain. 06/11/13   Elenora Gamma, MD  naproxen (NAPROSYN) 500 MG tablet Take 1 tablet (500 mg total) by mouth 2 (two) times daily. 11/12/14   Rolland Porter, MD  ondansetron (ZOFRAN ODT) 4 MG disintegrating tablet Take 1 tablet (4 mg total) by mouth every 8 (eight) hours as needed for nausea. 11/12/14   Rolland Porter, MD  oxycodone (ROXICODONE) 30 MG immediate release tablet Take 30-60 mg by mouth every 8 (eight) hours as needed. For pain.    Historical Provider, MD  Oxycodone  HCl (OXYCONTIN) 60 MG TB12 Take 1 tablet by mouth every 8 (eight) hours.    Historical Provider, MD  oxyCODONE-acetaminophen (PERCOCET/ROXICET) 5-325 MG per tablet Take 2 tablets by mouth every 4 (four) hours as needed. 11/12/14   Rolland Porter, MD  tamsulosin (FLOMAX) 0.4 MG CAPS capsule Take 1 capsule (0.4 mg total) by mouth daily. 11/12/14   Rolland Porter, MD   BP 109/78 mmHg  Pulse 58  Temp(Src) 97.9 F (36.6 C) (Oral)  Resp 18  Ht  (1.575 m)  Wt 130 lb (58.968 kg)  BMI 23.77 kg/m2  SpO2  98% Physical Exam  Constitutional: She is oriented to person, place, and time. She appears well-developed and well-nourished. No distress.  HENT:  Head: Normocephalic.  Eyes: Conjunctivae are normal. Pupils are equal, round, and reactive to light. No scleral icterus.  Neck: Normal range of motion. Neck supple. No thyromegaly present.  Cardiovascular: Normal rate and regular rhythm.  Exam reveals no gallop and no friction rub.   No murmur heard. Pulmonary/Chest: Effort normal and breath sounds normal. No respiratory distress. She has no wheezes. She has no rales.  Abdominal: Soft. Bowel sounds are normal. She exhibits no distension. There is no tenderness. There is no rebound.    Genitourinary:    Musculoskeletal: Normal range of motion.  Neurological: She is alert and oriented to person, place, and time.  Skin: Skin is warm and dry. No rash noted.  Psychiatric: She has a normal mood and affect. Her behavior is normal.    ED Course  Procedures (including critical care time) Labs Review Labs Reviewed  URINALYSIS, ROUTINE W REFLEX MICROSCOPIC - Abnormal; Notable for the following:    Hgb urine dipstick SMALL (*)    Leukocytes, UA TRACE (*)    All other components within normal limits  URINE MICROSCOPIC-ADD ON - Abnormal; Notable for the following:    Squamous Epithelial / LPF FEW (*)    Bacteria, UA FEW (*)    All other components within normal limits  PREGNANCY, URINE  CBC WITH DIFFERENTIAL/PLATELET  GC/CHLAMYDIA PROBE AMP ([place])    Imaging Review Ct Abdomen Pelvis Wo Contrast  11/12/2014   CLINICAL DATA:  Pelvic pain for 2 days, back pain  EXAM: CT ABDOMEN AND PELVIS WITHOUT CONTRAST  TECHNIQUE: Multidetector CT imaging of the abdomen and pelvis was performed following the standard protocol without IV contrast.  COMPARISON:  06/11/2013  FINDINGS: Sagittal images of the spine are unremarkable. Lung bases are unremarkable.  Unenhanced liver shows no biliary ductal  dilatation. Status postcholecystectomy. Unenhanced pancreas, spleen and adrenal glands are unremarkable. There is mild right hydronephrosis and right hydroureter. Small umbilical hernia containing fat. At least 4 or 5 calcified calculi are noted within right kidney the largest in lower pole measures 4.5 mm. At least 5 calcified calculi are noted within left kidney nonobstructive the largest in lower pole measures 6 mm.  Axial image 53 there is a calcified obstructive calculus in distal right ureter measures 8 x 5.3 mm at the lower level of right SI joint. There might be a second calculus in distal right ureter at same level measures about 3.3 mm.  Unenhanced uterus and adnexa are unremarkable. No small bowel obstruction. There is no pericecal inflammation. Scattered diverticula are noted right colon. No evidence of acute diverticulitis. Normal appendix noted axial image 52. Scattered diverticula are noted sigmoid colon. No evidence of acute diverticulitis. Limited assessment of the urinary bladder which is empty.  IMPRESSION: 1. There is  bilateral nonobstructive nephrolithiasis. 2. Mild right hydronephrosis and right hydroureter. 3. There is a 8 mm calcified obstructive calculus in distal right ureter at the lower level of right SI joint. There might be a second calcified calculus same level distal right ureter measures 3.3 mm. 4. Normal appendix.  No pericecal inflammation. 5. No calcified calculi are noted within under distended urinary bladder.   Electronically Signed   By: Natasha Mead M.D.   On: 11/12/2014 13:33   US Transvaginal Non-ob  11/12/2014   CLINICAL DATA:  Pelvic/low back pain  EXAM: TRANSABDOMINAL AND TRANSVAGINAL ULTRASOUND OF PELVIS  TECHNIQUE: Both transabdominal and transvaginal ultrasound examinations of the pelvis were performed. Transabdominal technique was performed for global imaging of the pelvis including uterus, ovaries, adnexal regions, and pelvic cul-de-sac. It was necessary to proceed  with endovaginal exam following the transabdominal exam to visualize the uterus and bilateral ovaries.  COMPARISON:  CT abdomen pelvis dated 06/11/2013  FINDINGS: Uterus  Measurements: 7.5 x 4.2 x 4.9 cm. No fibroids or other mass visualized.  Endometrium  Thickness: 3 mm.  No focal abnormality visualized.  Right ovary  Measurements: 3.1 x 2.0 x 2.8 cm. Small cysts/follicles measuring up to 1.9 cm, within normal limits.  Left ovary  Measurements: 2.7 x 1.7 x 2.2 cm. Normal appearance/no adnexal mass.  Other findings  No free fluid.  IMPRESSION: Negative pelvic ultrasound.   Electronically Signed   By: Charline Bills M.D.   On: 11/12/2014 12:48   US Pelvis Complete  11/12/2014   CLINICAL DATA:  Pelvic/low back pain  EXAM: TRANSABDOMINAL AND TRANSVAGINAL ULTRASOUND OF PELVIS  TECHNIQUE: Both transabdominal and transvaginal ultrasound examinations of the pelvis were performed. Transabdominal technique was performed for global imaging of the pelvis including uterus, ovaries, adnexal regions, and pelvic cul-de-sac. It was necessary to proceed with endovaginal exam following the transabdominal exam to visualize the uterus and bilateral ovaries.  COMPARISON:  CT abdomen pelvis dated 06/11/2013  FINDINGS: Uterus  Measurements: 7.5 x 4.2 x 4.9 cm. No fibroids or other mass visualized.  Endometrium  Thickness: 3 mm.  No focal abnormality visualized.  Right ovary  Measurements: 3.1 x 2.0 x 2.8 cm. Small cysts/follicles measuring up to 1.9 cm, within normal limits.  Left ovary  Measurements: 2.7 x 1.7 x 2.2 cm. Normal appearance/no adnexal mass.  Other findings  No free fluid.  IMPRESSION: Negative pelvic ultrasound.   Electronically Signed   By: Charline Bills M.D.   On: 11/12/2014 12:48     EKG Interpretation None      MDM   Final diagnoses:  Kidney stone    The scan shows large distal ureteral stone. Ultimately was able to convince her to have IV pain medications. Is more comfortable now. She has  urologist hypertension like to follow with. Given a lites urology as well. Discharged on Percocet, Zofran, naproxen, max. Return if any worsening symptoms.    Rolland Porter, MD 11/12/14 [phone]

## 2014-11-12 NOTE — Discharge Instructions (Signed)

## 2014-11-12 NOTE — ED Notes (Signed)
Pt reports pelvic pain x2 days that radiates into back. Reports relief noted with heating pad and rest. Denies vaginal discharge.

## 2014-11-12 NOTE — ED Notes (Signed)
Patient transported to CT 

## 2014-11-15 LAB — GC/CHLAMYDIA PROBE AMP (~~LOC~~) NOT AT ARMC
Chlamydia: NEGATIVE
Neisseria Gonorrhea: NEGATIVE

## 2015-08-05 ENCOUNTER — Emergency Department (HOSPITAL_COMMUNITY)
Admission: EM | Admit: 2015-08-05 | Discharge: 2015-08-05 | Disposition: A | Discharge status: Home / Self Care | Payer: Self-pay | Attending: Emergency Medicine | Admitting: Emergency Medicine

## 2015-08-05 ENCOUNTER — Encounter (HOSPITAL_COMMUNITY): Payer: Self-pay

## 2015-08-05 ENCOUNTER — Emergency Department (HOSPITAL_COMMUNITY): Payer: Self-pay

## 2015-08-05 DIAGNOSIS — Z8679 Personal history of other diseases of the circulatory system: Secondary | ICD-10-CM | POA: Insufficient documentation

## 2015-08-05 DIAGNOSIS — R109 Unspecified abdominal pain: Secondary | ICD-10-CM | POA: Insufficient documentation

## 2015-08-05 DIAGNOSIS — Z8742 Personal history of other diseases of the female genital tract: Secondary | ICD-10-CM | POA: Insufficient documentation

## 2015-08-05 DIAGNOSIS — F17210 Nicotine dependence, cigarettes, uncomplicated: Secondary | ICD-10-CM | POA: Insufficient documentation

## 2015-08-05 DIAGNOSIS — Z8719 Personal history of other diseases of the digestive system: Secondary | ICD-10-CM | POA: Insufficient documentation

## 2015-08-05 DIAGNOSIS — N200 Calculus of kidney: Secondary | ICD-10-CM | POA: Insufficient documentation

## 2015-08-05 DIAGNOSIS — M1990 Unspecified osteoarthritis, unspecified site: Secondary | ICD-10-CM | POA: Insufficient documentation

## 2015-08-05 DIAGNOSIS — Z3202 Encounter for pregnancy test, result negative: Secondary | ICD-10-CM | POA: Insufficient documentation

## 2015-08-05 HISTORY — DX: Calculus of kidney: N20.0

## 2015-08-05 LAB — BASIC METABOLIC PANEL
ANION GAP: 8 (ref 5–15)
BUN: 11 mg/dL (ref 6–20)
CALCIUM: 8.7 mg/dL — AB (ref 8.9–10.3)
CO2: 24 mmol/L (ref 22–32)
Chloride: 105 mmol/L (ref 101–111)
Creatinine, Ser: 0.69 mg/dL (ref 0.44–1.00)
Glucose, Bld: 108 mg/dL — ABNORMAL HIGH (ref 65–99)
Potassium: 3.8 mmol/L (ref 3.5–5.1)
SODIUM: 137 mmol/L (ref 135–145)

## 2015-08-05 LAB — CBC
HCT: 40.5 % (ref 36.0–46.0)
HEMOGLOBIN: 13.6 g/dL (ref 12.0–15.0)
MCH: 31.4 pg (ref 26.0–34.0)
MCHC: 33.6 g/dL (ref 30.0–36.0)
MCV: 93.5 fL (ref 78.0–100.0)
PLATELETS: 254 10*3/uL (ref 150–400)
RBC: 4.33 MIL/uL (ref 3.87–5.11)
RDW: 12.8 % (ref 11.5–15.5)
WBC: 7 10*3/uL (ref 4.0–10.5)

## 2015-08-05 LAB — URINALYSIS, ROUTINE W REFLEX MICROSCOPIC
Bilirubin Urine: NEGATIVE
Glucose, UA: NEGATIVE mg/dL
Ketones, ur: NEGATIVE mg/dL
LEUKOCYTES UA: NEGATIVE
NITRITE: NEGATIVE
PROTEIN: NEGATIVE mg/dL
Specific Gravity, Urine: 1.015 (ref 1.005–1.030)
pH: 6.5 (ref 5.0–8.0)

## 2015-08-05 LAB — URINE MICROSCOPIC-ADD ON

## 2015-08-05 LAB — POC URINE PREG, ED: Preg Test, Ur: NEGATIVE

## 2015-08-05 MED ORDER — HYDROMORPHONE HCL 1 MG/ML IJ SOLN
1.0000 mg | Freq: Once | INTRAMUSCULAR | Status: AC
  Administered 2015-08-05: 1 mg via INTRAVENOUS
  Filled 2015-08-05: qty 1

## 2015-08-05 MED ORDER — SODIUM CHLORIDE 0.9 % IV BOLUS (SEPSIS)
1000.0000 mL | Freq: Once | INTRAVENOUS | Status: AC
  Administered 2015-08-05: 1000 mL via INTRAVENOUS

## 2015-08-05 MED ORDER — KETOROLAC TROMETHAMINE 15 MG/ML IJ SOLN
15.0000 mg | Freq: Once | INTRAMUSCULAR | Status: AC
  Administered 2015-08-05: 15 mg via INTRAVENOUS
  Filled 2015-08-05: qty 1

## 2015-08-05 MED ORDER — ONDANSETRON HCL 4 MG/2ML IJ SOLN
4.0000 mg | Freq: Once | INTRAMUSCULAR | Status: AC
  Administered 2015-08-05: 4 mg via INTRAVENOUS
  Filled 2015-08-05: qty 2

## 2015-08-05 NOTE — ED Provider Notes (Signed)
CSN: [id number]     Arrival date & time 08/05/15  1302 History   First MD Initiated Contact with Patient 08/05/15 1708     Chief Complaint  Patient presents with  . Flank Pain     The history is provided by the patient. No language interpreter was used.   Morgan James is a 45 y.o. female who presents to the Emergency Department complaining of flank pain. She reports 1 day of left flank pain radiating to her left groin. She has mild right-sided flank pain as well. She has nausea but no vomiting. She has a history of kidney stones that have required stents in the past, most recently in April 2016. Pain is similar to prior kidney stones. She did pass a portion of a kidney stone while out in the lobby.  Past Medical History  Diagnosis Date  . Fibromyalgia   . Sjogren's disease (HCC)   . Raynaud's disease   . Osteoarthritis   . AC (acromioclavicular) joint bone spurs   . Rheumatoid arthritis (HCC)   . Ovarian cyst   . IBS (irritable bowel syndrome)   . Kidney stones    Past Surgical History  Procedure Laterality Date  . C sections    . Cholecystectomy    . Endometrial ablation    . Lithotripsy     Family History  Problem Relation Age of Onset  . Hypertension Other   . Diabetes Other   . Cancer Other    Social History  Substance Use Topics  . Smoking status: Current Every Day Smoker -- 0.50 packs/day    Types: Cigarettes  . Smokeless tobacco: None  . Alcohol Use: No   OB History    No data available     Review of Systems  All other systems reviewed and are negative.     Allergies  Review of patient's allergies indicates no known allergies.  Home Medications   Prior to Admission medications   Medication Sig Start Date End Date Taking? Authorizing Provider  Aspirin-Salicylamide-Caffeine (BC HEADACHE POWDER PO) Take 1 Package by mouth daily as needed (pain).   Yes Historical Provider, MD  amoxicillin (AMOXIL) 500 MG capsule Take 1 capsule (500 mg total) by mouth 3  (three) times daily. Patient not taking: Reported on 08/05/2015 07/10/14   Kathrynn Speed, PA-C  HYDROcodone-acetaminophen (NORCO/VICODIN) 5-325 MG per tablet Take 1-2 tablets by mouth every 4 (four) hours as needed. Patient not taking: Reported on 08/05/2015 07/10/14   Kathrynn Speed, PA-C  ketorolac (TORADOL) 10 MG tablet Take 1 tablet (10 mg total) by mouth every 6 (six) hours as needed for moderate pain. Patient not taking: Reported on 08/05/2015 06/11/13   Elenora Gamma, MD  naproxen (NAPROSYN) 500 MG tablet Take 1 tablet (500 mg total) by mouth 2 (two) times daily. Patient not taking: Reported on 08/05/2015 11/12/14   Rolland Porter, MD  ondansetron (ZOFRAN ODT) 4 MG disintegrating tablet Take 1 tablet (4 mg total) by mouth every 8 (eight) hours as needed for nausea. Patient not taking: Reported on 08/05/2015 11/12/14   Rolland Porter, MD  oxyCODONE-acetaminophen (PERCOCET/ROXICET) 5-325 MG per tablet Take 2 tablets by mouth every 4 (four) hours as needed. Patient not taking: Reported on 08/05/2015 11/12/14   Rolland Porter, MD  tamsulosin (FLOMAX) 0.4 MG CAPS capsule Take 1 capsule (0.4 mg total) by mouth daily. Patient not taking: Reported on 08/05/2015 11/12/14   Rolland Porter, MD   BP 115/73 mmHg  Pulse 55  Temp(Src) 97.8 F (36.6 C) (Oral)  Resp 16  SpO2 95% Physical Exam  Constitutional: She is oriented to person, place, and time. She appears well-developed and well-nourished.  HENT:  Head: Normocephalic and atraumatic.  Cardiovascular: Normal rate and regular rhythm.   No murmur heard. Pulmonary/Chest: Effort normal and breath sounds normal. No respiratory distress.  Abdominal: Soft. There is no tenderness. There is no rebound and no guarding.  Mild left CVA tenderness  Musculoskeletal: She exhibits no edema or tenderness.  Neurological: She is alert and oriented to person, place, and time.  Skin: Skin is warm and dry.  Psychiatric: She has a normal mood and affect. Her behavior is normal.  Nursing  note and vitals reviewed.   ED Course  Procedures (including critical care time) Labs Review Labs Reviewed  URINALYSIS, ROUTINE W REFLEX MICROSCOPIC (NOT AT [facility]) - Abnormal; Notable for the following:    Hgb urine dipstick SMALL (*)    All other components within normal limits  BASIC METABOLIC PANEL - Abnormal; Notable for the following:    Glucose, Bld 108 (*)    Calcium 8.7 (*)    All other components within normal limits  URINE MICROSCOPIC-ADD ON - Abnormal; Notable for the following:    Squamous Epithelial / LPF 0-5 (*)    Bacteria, UA RARE (*)    All other components within normal limits  CBC  POC URINE PREG, ED    Imaging Review Ct Renal Stone Study  08/05/2015  CLINICAL DATA:  Patient with bilateral flank pain radiating into the bilateral groins. EXAM: CT ABDOMEN AND PELVIS WITHOUT CONTRAST TECHNIQUE: Multidetector CT imaging of the abdomen and pelvis was performed following the standard protocol without IV contrast. COMPARISON:  CT abdomen pelvis 11/12/2014. FINDINGS: Lower chest: Normal heart size. No consolidative or nodular pulmonary opacities. No pleural effusion. Hepatobiliary: Liver is normal in size and contour. Liver is normal in size and contour. Patient status post cholecystectomy. Pancreas: Unremarkable. Spleen: Unremarkable. Adrenals/Urinary Tract: The adrenal glands are normal. Kidneys are symmetric in size. Re- demonstrated bilateral nonobstructing nephrolithiasis measuring up to 5 mm within the superior pole of the right kidney and 6 mm within the inferior pole of the left kidney. No hydronephrosis. No ureterolithiasis. Urinary bladder is unremarkable. Stomach/Bowel: Sigmoid colonic diverticulosis. No CT evidence for acute diverticulitis. No evidence for bowel obstruction. Normal morphology to the stomach. Vascular/Lymphatic: Normal caliber abdominal aorta. No retroperitoneal lymphadenopathy. Other: Uterus and adnexal structures are unremarkable. Musculoskeletal: Lumbar  spine degenerative changes. No aggressive or acute appearing osseous lesions. IMPRESSION: Bilateral nonobstructing nephrolithiasis. No acute process within the abdomen or pelvis. Electronically Signed   By: Annia Belt M.D.   On: 08/05/2015 18:33   I have personally reviewed and evaluated these images and lab results as part of my medical decision-making.   EKG Interpretation None      MDM   Final diagnoses:  Left flank pain    Patient with history of kidney stones here for flank pain, similar to prior stones. On repeat evaluation following pain meds she is pain-free in the department. UA is not consistent with UTI. CT stone study demonstrates nephrolithiasis with no evidence of obstruction. She did pass a stone just prior to evaluation in the emergency department, question some of this pain related to recently passed stone. Discussed oral fluid hydration, outpatient Urology follow-up, return precautions.    Tilden Fossa, MD 08/05/15 1907

## 2015-08-05 NOTE — ED Notes (Signed)
MD at bedside. 

## 2015-08-05 NOTE — Discharge Instructions (Signed)
Your CT scan shows that you have stones that are in your kidneys but none that should be causing a blockage or pain at this time.  Get rechecked if you develop severe, uncontrolled pain, fevers, or new concerning symptoms.    Flank Pain Flank pain refers to pain that is located on the side of the body between the upper abdomen and the back. The pain may occur over a short period of time (acute) or may be long-term or reoccurring (chronic). It may be mild or severe. Flank pain can be caused by many things. CAUSES  Some of the more common causes of flank pain include:  Muscle strains.   Muscle spasms.   A disease of your spine (vertebral disk disease).   A lung infection (pneumonia).   Fluid around your lungs (pulmonary edema).   A kidney infection.   Kidney stones.   A very painful skin rash caused by the chickenpox virus (shingles).   Gallbladder disease.  HOME CARE INSTRUCTIONS  Home care will depend on the cause of your pain. In general,  Rest as directed by your caregiver.  Drink enough fluids to keep your urine clear or pale yellow.  Only take over-the-counter or prescription medicines as directed by your caregiver. Some medicines may help relieve the pain.  Tell your caregiver about any changes in your pain.  Follow up with your caregiver as directed. SEEK IMMEDIATE MEDICAL CARE IF:   Your pain is not controlled with medicine.   You have new or worsening symptoms.  Your pain increases.   You have abdominal pain.   You have shortness of breath.   You have persistent nausea or vomiting.   You have swelling in your abdomen.   You feel faint or pass out.   You have blood in your urine.  You have a fever or persistent symptoms for more than 2-3 days.  You have a fever and your symptoms suddenly get worse. MAKE SURE YOU:   Understand these instructions.  Will watch your condition.  Will get help right away if you are not doing well or  get worse.   This information is not intended to replace advice given to you by your health care provider. Make sure you discuss any questions you have with your health care provider.   Document Released: 09/06/2005 Document Revised: 04/09/2012 Document Reviewed: 02/28/2012 Elsevier Interactive Patient Education 2016 [employer] Renal Colic Renal colic is pain that is caused by passing a kidney stone. The pain can be sharp and severe. It may be felt in the back, abdomen, side (flank), or groin. It can cause nausea. Renal colic can come and go. HOME CARE INSTRUCTIONS Watch your condition for any changes. The following actions may help to lessen any discomfort that you are feeling:  Take medicines only as directed by your health care provider.  Ask your health care provider if it is okay to take over-the-counter pain medicine.  Drink enough fluid to keep your urine clear or pale yellow. Drink 6-8 glasses of water each day.  Limit the amount of salt that you eat to less than 2 grams per day.  Reduce the amount of protein in your diet. Eat less meat, fish, nuts, and dairy.  Avoid foods such as spinach, rhubarb, nuts, or bran. These may make kidney stones more likely to form. SEEK MEDICAL CARE IF:  You have a fever or chills.  Your urine smells bad or looks cloudy.  You have pain or burning when  you pass urine. SEEK IMMEDIATE MEDICAL CARE IF:  Your flank pain or groin pain suddenly worsens.  You become confused or disoriented or you lose consciousness.   This information is not intended to replace advice given to you by your health care provider. Make sure you discuss any questions you have with your health care provider.   Document Released: 04/25/2005 Document Revised: 08/06/2014 Document Reviewed: 05/26/2014 Elsevier Interactive Patient Education [employer]

## 2015-08-05 NOTE — ED Notes (Signed)
Pt c/o bilateral flank pain radiating into bilateral groins x 1 day.  Pain score 8/10.  Pt reports taking Goody powder x 2 this morning.  Hx of kidney stones.  Sts "I've been trying to flush it out w/ fluid, but it's not working.  Prior bilateral stents.

## 2015-09-30 ENCOUNTER — Encounter (HOSPITAL_BASED_OUTPATIENT_CLINIC_OR_DEPARTMENT_OTHER): Payer: Self-pay | Admitting: Emergency Medicine

## 2015-09-30 ENCOUNTER — Emergency Department (HOSPITAL_BASED_OUTPATIENT_CLINIC_OR_DEPARTMENT_OTHER)
Admission: EM | Admit: 2015-09-30 | Discharge: 2015-09-30 | Disposition: A | Discharge status: Home / Self Care | Payer: BLUE CROSS/BLUE SHIELD | Attending: Emergency Medicine | Admitting: Emergency Medicine

## 2015-09-30 DIAGNOSIS — Z8742 Personal history of other diseases of the female genital tract: Secondary | ICD-10-CM | POA: Insufficient documentation

## 2015-09-30 DIAGNOSIS — Z8719 Personal history of other diseases of the digestive system: Secondary | ICD-10-CM | POA: Diagnosis not present

## 2015-09-30 DIAGNOSIS — Z8739 Personal history of other diseases of the musculoskeletal system and connective tissue: Secondary | ICD-10-CM | POA: Insufficient documentation

## 2015-09-30 DIAGNOSIS — J069 Acute upper respiratory infection, unspecified: Secondary | ICD-10-CM | POA: Insufficient documentation

## 2015-09-30 DIAGNOSIS — Z8679 Personal history of other diseases of the circulatory system: Secondary | ICD-10-CM | POA: Diagnosis not present

## 2015-09-30 DIAGNOSIS — Z87442 Personal history of urinary calculi: Secondary | ICD-10-CM | POA: Insufficient documentation

## 2015-09-30 DIAGNOSIS — Z7982 Long term (current) use of aspirin: Secondary | ICD-10-CM | POA: Diagnosis not present

## 2015-09-30 DIAGNOSIS — F17210 Nicotine dependence, cigarettes, uncomplicated: Secondary | ICD-10-CM | POA: Diagnosis not present

## 2015-09-30 DIAGNOSIS — R05 Cough: Secondary | ICD-10-CM | POA: Diagnosis present

## 2015-09-30 DIAGNOSIS — R059 Cough, unspecified: Secondary | ICD-10-CM

## 2015-09-30 MED ORDER — BENZONATATE 100 MG PO CAPS: 200.0000 mg | ORAL_CAPSULE | Freq: Two times a day (BID) | ORAL | Status: DC | PRN

## 2015-09-30 MED ORDER — OXYMETAZOLINE HCL 0.05 % NA SOLN: 1.0000 | Freq: Two times a day (BID) | NASAL | Status: DC

## 2015-09-30 NOTE — ED Provider Notes (Signed)
CSN: [id number]     Arrival date & time 09/30/15  1639 History   First MD Initiated Contact with Patient 09/30/15 1651     Chief Complaint  Patient presents with  . Cough     (Consider location/radiation/quality/duration/timing/severity/associated sxs/prior Treatment) HPI   She is a 45 year old female with past medical history of fibromyalgia, Sjogren's, IBS and rain on who presents to the ED with complaint of dry cough, onset 6 days. Patient reports she began having a dry cough over the past week which she notes has worsened. She also reports having associated sore throat which she notes has improved since Monday. Endorses associated rhinorrhea, nasal congestion, postnasal drainage, subjective fever, chills, body aches. Denies headache, ear pain, facial/neck swelling, dysphasia, hemoptysis, wheezing, palpitations, abdominal pain, nausea, vomiting, diarrhea, urinary symptoms. Patient reports she was seen at an urgent care center 4 days ago, negative strep and flu and was sent home with symptomatic treatment. Patient reports she has been using NyQuil, Advil and Mucinex without relief. Denies any recent sick contacts.    Past Medical History  Diagnosis Date  . Fibromyalgia   . Sjogren's disease (HCC)   . Raynaud's disease   . Osteoarthritis   . AC (acromioclavicular) joint bone spurs   . Rheumatoid arthritis (HCC)   . Ovarian cyst   . IBS (irritable bowel syndrome)   . Kidney stones    Past Surgical History  Procedure Laterality Date  . C sections    . Cholecystectomy    . Endometrial ablation    . Lithotripsy     Family History  Problem Relation Age of Onset  . Hypertension Other   . Diabetes Other   . Cancer Other    Social History  Substance Use Topics  . Smoking status: Current Every Day Smoker -- 0.50 packs/day    Types: Cigarettes  . Smokeless tobacco: None  . Alcohol Use: No   OB History    No data available     Review of Systems  Constitutional: Positive for  fever (subjective).  HENT: Positive for congestion, postnasal drip, rhinorrhea and sore throat.   Respiratory: Positive for cough.   All other systems reviewed and are negative.     Allergies  Review of patient's allergies indicates no known allergies.  Home Medications   Prior to Admission medications   Medication Sig Start Date End Date Taking? Authorizing Provider  amoxicillin (AMOXIL) 500 MG capsule Take 1 capsule (500 mg total) by mouth 3 (three) times daily. Patient not taking: Reported on 08/05/2015 07/10/14   Kathrynn Speed, PA-C  Aspirin-Salicylamide-Caffeine (BC HEADACHE POWDER PO) Take 1 Package by mouth daily as needed (pain).    Historical Provider, MD  benzonatate (TESSALON) 100 MG capsule Take 2 capsules (200 mg total) by mouth 2 (two) times daily as needed for cough. 09/30/15   Barrett Henle, PA-C  HYDROcodone-acetaminophen (NORCO/VICODIN) 5-325 MG per tablet Take 1-2 tablets by mouth every 4 (four) hours as needed. Patient not taking: Reported on 08/05/2015 07/10/14   Kathrynn Speed, PA-C  ketorolac (TORADOL) 10 MG tablet Take 1 tablet (10 mg total) by mouth every 6 (six) hours as needed for moderate pain. Patient not taking: Reported on 08/05/2015 06/11/13   Elenora Gamma, MD  naproxen (NAPROSYN) 500 MG tablet Take 1 tablet (500 mg total) by mouth 2 (two) times daily. Patient not taking: Reported on 08/05/2015 11/12/14   Rolland Porter, MD  ondansetron (ZOFRAN ODT) 4 MG disintegrating tablet Take 1 tablet (  4 mg total) by mouth every 8 (eight) hours as needed for nausea. Patient not taking: Reported on 08/05/2015 11/12/14   Rolland Porter, MD  oxyCODONE-acetaminophen (PERCOCET/ROXICET) 5-325 MG per tablet Take 2 tablets by mouth every 4 (four) hours as needed. Patient not taking: Reported on 08/05/2015 11/12/14   Rolland Porter, MD  oxymetazoline [facility] NASAL SPRAY) 0.05 % nasal spray Place 1 spray into both nostrils 2 (two) times daily. 09/30/15   Barrett Henle, PA-C  tamsulosin  (FLOMAX) 0.4 MG CAPS capsule Take 1 capsule (0.4 mg total) by mouth daily. Patient not taking: Reported on 08/05/2015 11/12/14   Rolland Porter, MD   BP 133/84 mmHg  Pulse 107  Temp(Src) 98.1 F (36.7 C) (Oral)  Resp 20  Ht 5\' 2"  (1.575 m)  Wt 60.328 kg  BMI 24.32 kg/m2  SpO2 100% Physical Exam  Constitutional: She is oriented to person, place, and time. She appears well-developed and well-nourished. No distress.  HENT:  Head: Normocephalic and atraumatic.  Right Ear: Tympanic membrane normal.  Left Ear: Tympanic membrane normal.  Nose: Nose normal. Right sinus exhibits no maxillary sinus tenderness and no frontal sinus tenderness. Left sinus exhibits no maxillary sinus tenderness and no frontal sinus tenderness.  Mouth/Throat: Uvula is midline and mucous membranes are normal. Posterior oropharyngeal erythema present. No oropharyngeal exudate, posterior oropharyngeal edema or tonsillar abscesses.  Eyes: Conjunctivae and EOM are normal. Right eye exhibits no discharge. Left eye exhibits no discharge. No scleral icterus.  Neck: Normal range of motion. Neck supple.  Cardiovascular: Normal rate, regular rhythm, normal heart sounds and intact distal pulses.   HR 92  Pulmonary/Chest: Effort normal and breath sounds normal. No respiratory distress. She has no wheezes. She has no rales. She exhibits no tenderness.  Abdominal: Soft. Bowel sounds are normal. She exhibits no distension and no mass. There is no tenderness. There is no rebound and no guarding.  Musculoskeletal: Normal range of motion. She exhibits no edema.  Lymphadenopathy:    She has no cervical adenopathy.  Neurological: She is alert and oriented to person, place, and time.  Skin: Skin is warm and dry. She is not diaphoretic.  Nursing note and vitals reviewed.   ED Course  Procedures (including critical care time) Labs Review Labs Reviewed - No data to display  Imaging Review No results found. I have personally reviewed and  evaluated these images and lab results as part of my medical decision-making.   EKG Interpretation None      MDM   Final diagnoses:  Cough  URI (upper respiratory infection)    Patients symptoms are consistent with URI, likely viral etiology. Lungs CTAB, I do not feel that a CXR is needed at this tim for evaluation. No trismus, drooling, neck swelling or stridor on exam. Patient had a negative strep and flu tests which were performed at an urgent care center 4 days ago. Discussed that antibiotics are not indicated for viral infections. Pt will be discharged with rx for afrin, tessalon and discussed symptomatic treatment.  Verbalizes understanding and is agreeable with plan. Pt is hemodynamically stable & in NAD prior to dc.     [place], [place] 09/30/15 1727  11/30/15, MD 10/01/15 1227

## 2015-09-30 NOTE — ED Notes (Signed)
45 yo with strong dry cough since Monday was already tested for flu and strep both negative. Taking OTC meds.

## 2015-09-30 NOTE — Discharge Instructions (Signed)
Take your medications as prescribed. I also recommend using a cool mist humidifier to help with your congestion and cough. May also drink warm water 2 with honey for a cough suppressant. I recommend drinking 2-3 L of water daily to remain hydrated. I recommend continuing to take Tylenol or ibuprofen as prescribed over-the-counter scan for fever and body aches. Please follow up with a primary care provider from the Resource Guide provided below in 4-5 days. Please return to the Emergency Department if symptoms worsen or new onset of fever, coughing up blood, difficulty breathing, wheezing, chest pain, lightheadedness, dizziness, syncope.

## 2015-09-30 NOTE — ED Notes (Signed)
Pt. Very abrupt about her not taking the meds prescribed for her and getting something different for her cough at the [facility] store.  RN just continued with D/C instructions.

## 2015-10-03 MED FILL — BENZONATATE 100 MG CAPSULE: 100 | 5 days supply | Qty: 20 | Fill #0

## 2015-10-04 ENCOUNTER — Emergency Department (INDEPENDENT_AMBULATORY_CARE_PROVIDER_SITE_OTHER)
Admission: EM | Admit: 2015-10-04 | Discharge: 2015-10-04 | Disposition: A | Discharge status: Home / Self Care | Payer: BLUE CROSS/BLUE SHIELD | Source: Home / Self Care | Attending: Family Medicine | Admitting: Family Medicine

## 2015-10-04 DIAGNOSIS — J069 Acute upper respiratory infection, unspecified: Secondary | ICD-10-CM | POA: Diagnosis not present

## 2015-10-04 DIAGNOSIS — R053 Chronic cough: Secondary | ICD-10-CM

## 2015-10-04 DIAGNOSIS — R05 Cough: Secondary | ICD-10-CM | POA: Diagnosis not present

## 2015-10-04 MED ORDER — PREDNISONE 20 MG PO TABS: ORAL_TABLET | ORAL | Status: DC

## 2015-10-04 MED ORDER — ALBUTEROL SULFATE HFA 108 (90 BASE) MCG/ACT IN AERS: 1.0000 | INHALATION_SPRAY | Freq: Four times a day (QID) | RESPIRATORY_TRACT | Status: DC | PRN

## 2015-10-04 MED ORDER — AZITHROMYCIN 250 MG PO TABS: 250.0000 mg | ORAL_TABLET | Freq: Every day | ORAL | Status: DC

## 2015-10-04 NOTE — Discharge Instructions (Signed)
You may take 400-600mg Ibuprofen (Motrin) every 6-8 hours for fever and pain  °Alternate with Tylenol  °You may take 500mg Tylenol every 4-6 hours as needed for fever and pain  °Follow-up with your primary care provider next week for recheck of symptoms if not improving.  °Be sure to drink plenty of fluids and rest, at least 8hrs of sleep a night, preferably more while you are sick. °Return urgent care or go to closest ER if you cannot keep down fluids/signs of dehydration, fever not reducing with Tylenol, difficulty breathing/wheezing, stiff neck, worsening condition, or other concerns (see below)  °Please take antibiotics as prescribed and be sure to complete entire course even if you start to feel better to ensure infection does not come back. ° ° °Cool Mist Vaporizers °Vaporizers may help relieve the symptoms of a cough and cold. They add moisture to the air, which helps mucus to become thinner and less sticky. This makes it easier to breathe and cough up secretions. Cool mist vaporizers do not cause serious burns like hot mist vaporizers, which may also be called steamers or humidifiers. Vaporizers have not been proven to help with colds. You should not use a vaporizer if you are allergic to mold. °HOME CARE INSTRUCTIONS °· Follow the package instructions for the vaporizer. °· Do not use anything other than distilled water in the vaporizer. °· Do not run the vaporizer all of the time. This can cause mold or bacteria to grow in the vaporizer. °· Clean the vaporizer after each time it is used. °· Clean and dry the vaporizer well before storing it. °· Stop using the vaporizer if worsening respiratory symptoms develop. °  °This information is not intended to replace advice given to you by your health care provider. Make sure you discuss any questions you have with your health care provider. °  °Document Released: 04/12/2004 Document Revised: 07/21/2013 Document Reviewed: 12/03/2012 °Elsevier Interactive Patient  Education ©2016 [employer] ° °

## 2015-10-04 NOTE — ED Notes (Signed)
Started Saturday 2 weeks ago with sore throat, went to MD Monday negative for strep and flu.  Cough started last Wed, went back to MD on Friday.  Was given Tessalon pearls and afrin which did not work.

## 2015-10-04 NOTE — ED Provider Notes (Signed)
CSN: [id number]     Arrival date & time 10/04/15  1194 History   First MD Initiated Contact with Patient 10/04/15 [phone]     Chief Complaint  Patient presents with  . Cough   (Consider location/radiation/quality/duration/timing/severity/associated sxs/prior Treatment) HPI  The pt is a 45yo female presenting to [facility] with c/o 2 weeks of persistent cough that has not improved with prescribed Tessalon or Afrin as she was initially seen by a different urgent care and tested negative for strep and flu, then was seen 4 days ago at [facility] but was advised she had a virus.  She cannot sleep due to the coughing.  She also reports fatigue, body aches, and subjective fever. Multiple people she knows are also sick. Denies hx of asthma but states she feels she has irritated her bronchial tubes as she has chest soreness from all the coughing.  Past Medical History  Diagnosis Date  . Fibromyalgia   . Sjogren's disease (HCC)   . Raynaud's disease   . Osteoarthritis   . AC (acromioclavicular) joint bone spurs   . Rheumatoid arthritis (HCC)   . Ovarian cyst   . IBS (irritable bowel syndrome)   . Kidney stones    Past Surgical History  Procedure Laterality Date  . C sections    . Cholecystectomy    . Endometrial ablation    . Lithotripsy     Family History  Problem Relation Age of Onset  . Hypertension Other   . Diabetes Other   . Cancer Other    Social History  Substance Use Topics  . Smoking status: Current Every Day Smoker -- 0.50 packs/day    Types: Cigarettes  . Smokeless tobacco: None  . Alcohol Use: No   OB History    No data available     Review of Systems  Constitutional: Positive for fatigue. Negative for fever and chills.  HENT: Positive for congestion, sore throat and voice change. Negative for ear pain and trouble swallowing.   Respiratory: Positive for cough and shortness of breath.   Cardiovascular: Positive for chest pain. Negative for palpitations.  Gastrointestinal:  Positive for nausea. Negative for vomiting, abdominal pain and diarrhea.  Musculoskeletal: Negative for myalgias, back pain and arthralgias.  Skin: Negative for rash.  Neurological: Positive for headaches. Negative for dizziness and light-headedness.  All other systems reviewed and are negative.   Allergies  Review of patient's allergies indicates no known allergies.  Home Medications   Prior to Admission medications   Medication Sig Start Date End Date Taking? Authorizing Provider  albuterol (PROVENTIL HFA;VENTOLIN HFA) 108 (90 Base) MCG/ACT inhaler Inhale 1-2 puffs into the lungs every 6 (six) hours as needed for wheezing or shortness of breath. 10/04/15   Junius Finner, PA-C  amoxicillin (AMOXIL) 500 MG capsule Take 1 capsule (500 mg total) by mouth 3 (three) times daily. Patient not taking: Reported on 08/05/2015 07/10/14   Kathrynn Speed, PA-C  Aspirin-Salicylamide-Caffeine (BC HEADACHE POWDER PO) Take 1 Package by mouth daily as needed (pain).    Historical Provider, MD  azithromycin (ZITHROMAX) 250 MG tablet Take 1 tablet (250 mg total) by mouth daily. Take first 2 tablets together, then 1 every day until finished. 10/04/15   Junius Finner, PA-C  benzonatate (TESSALON) 100 MG capsule Take 2 capsules (200 mg total) by mouth 2 (two) times daily as needed for cough. 09/30/15   Barrett Henle, PA-C  HYDROcodone-acetaminophen (NORCO/VICODIN) 5-325 MG per tablet Take 1-2 tablets by mouth every 4 (four)  hours as needed. Patient not taking: Reported on 08/05/2015 07/10/14   Kathrynn Speed, PA-C  ketorolac (TORADOL) 10 MG tablet Take 1 tablet (10 mg total) by mouth every 6 (six) hours as needed for moderate pain. Patient not taking: Reported on 08/05/2015 06/11/13   Elenora Gamma, MD  naproxen (NAPROSYN) 500 MG tablet Take 1 tablet (500 mg total) by mouth 2 (two) times daily. Patient not taking: Reported on 08/05/2015 11/12/14   Rolland Porter, MD  ondansetron (ZOFRAN ODT) 4 MG disintegrating tablet  Take 1 tablet (4 mg total) by mouth every 8 (eight) hours as needed for nausea. Patient not taking: Reported on 08/05/2015 11/12/14   Rolland Porter, MD  oxyCODONE-acetaminophen (PERCOCET/ROXICET) 5-325 MG per tablet Take 2 tablets by mouth every 4 (four) hours as needed. Patient not taking: Reported on 08/05/2015 11/12/14   Rolland Porter, MD  oxymetazoline [facility] NASAL SPRAY) 0.05 % nasal spray Place 1 spray into both nostrils 2 (two) times daily. 09/30/15   Barrett Henle, PA-C  predniSONE (DELTASONE) 20 MG tablet 3 tabs po day one, then 2 po daily x 4 days 10/04/15   Junius Finner, PA-C  tamsulosin (FLOMAX) 0.4 MG CAPS capsule Take 1 capsule (0.4 mg total) by mouth daily. Patient not taking: Reported on 08/05/2015 11/12/14   Rolland Porter, MD   Meds Ordered and Administered this Visit  Medications - No data to display  BP 143/98 mmHg  Pulse 100  Temp(Src) 98.1 F (36.7 C) (Oral)  Ht 5\' 2"  (1.575 m)  Wt 133 lb 12 oz (60.669 kg)  BMI 24.46 kg/m2  SpO2 100% No data found.   Physical Exam  Constitutional: She appears well-developed and well-nourished. No distress.  HENT:  Head: Normocephalic and atraumatic.  Right Ear: Tympanic membrane normal.  Left Ear: Tympanic membrane normal.  Nose: Nose normal.  Mouth/Throat: Uvula is midline, oropharynx is clear and moist and mucous membranes are normal.  Eyes: Conjunctivae are normal. No scleral icterus.  Neck: Normal range of motion. Neck supple.  Cardiovascular: Normal rate, regular rhythm and normal heart sounds.   Pulmonary/Chest: Effort normal and breath sounds normal. No respiratory distress. She has no wheezes. She has no rales.  Dry hacking cough on exam. Lungs: CTAB  Abdominal: Soft. She exhibits no distension. There is no tenderness.  Musculoskeletal: Normal range of motion.  Neurological: She is alert.  Skin: Skin is warm and dry. She is not diaphoretic.  Nursing note and vitals reviewed.   ED Course  Procedures (including critical  care time)  Labs Review Labs Reviewed - No data to display  Imaging Review No results found.    MDM   1. Acute upper respiratory infection   2. Persistent cough    Pt is c/o persistent cough for 2 weeks that is gradually worsening. No improvement with symptomatic treatment.  Will tx with Azithromycin to cover for atypical bacteria as well as provider albuterol inhaler and prednisone.   Advised pt to use acetaminophen and ibuprofen as needed for fever and pain. Encouraged rest and fluids. F/u with PCP in 1 week if not improving, sooner if worsening. Pt verbalized understanding and agreement with tx plan.   , PA-C 10/04/15 [phone]

## 2015-10-09 ENCOUNTER — Telehealth: Payer: Self-pay | Admitting: Emergency Medicine

## 2015-12-08 ENCOUNTER — Emergency Department (HOSPITAL_COMMUNITY)
Admission: EM | Admit: 2015-12-08 | Discharge: 2015-12-08 | Disposition: A | Discharge status: Home / Self Care | Payer: BLUE CROSS/BLUE SHIELD | Attending: Emergency Medicine | Admitting: Emergency Medicine

## 2015-12-08 ENCOUNTER — Emergency Department (HOSPITAL_COMMUNITY): Payer: BLUE CROSS/BLUE SHIELD

## 2015-12-08 ENCOUNTER — Encounter (HOSPITAL_COMMUNITY): Payer: Self-pay | Admitting: Emergency Medicine

## 2015-12-08 DIAGNOSIS — Z792 Long term (current) use of antibiotics: Secondary | ICD-10-CM | POA: Insufficient documentation

## 2015-12-08 DIAGNOSIS — K589 Irritable bowel syndrome without diarrhea: Secondary | ICD-10-CM | POA: Diagnosis not present

## 2015-12-08 DIAGNOSIS — M1990 Unspecified osteoarthritis, unspecified site: Secondary | ICD-10-CM | POA: Insufficient documentation

## 2015-12-08 DIAGNOSIS — M797 Fibromyalgia: Secondary | ICD-10-CM | POA: Diagnosis not present

## 2015-12-08 DIAGNOSIS — Z7982 Long term (current) use of aspirin: Secondary | ICD-10-CM | POA: Insufficient documentation

## 2015-12-08 DIAGNOSIS — Z79899 Other long term (current) drug therapy: Secondary | ICD-10-CM | POA: Insufficient documentation

## 2015-12-08 DIAGNOSIS — R109 Unspecified abdominal pain: Secondary | ICD-10-CM | POA: Diagnosis not present

## 2015-12-08 DIAGNOSIS — Z79891 Long term (current) use of opiate analgesic: Secondary | ICD-10-CM | POA: Insufficient documentation

## 2015-12-08 DIAGNOSIS — Z791 Long term (current) use of non-steroidal anti-inflammatories (NSAID): Secondary | ICD-10-CM | POA: Insufficient documentation

## 2015-12-08 DIAGNOSIS — F17210 Nicotine dependence, cigarettes, uncomplicated: Secondary | ICD-10-CM | POA: Insufficient documentation

## 2015-12-08 DIAGNOSIS — M069 Rheumatoid arthritis, unspecified: Secondary | ICD-10-CM | POA: Diagnosis not present

## 2015-12-08 DIAGNOSIS — Z7951 Long term (current) use of inhaled steroids: Secondary | ICD-10-CM | POA: Insufficient documentation

## 2015-12-08 DIAGNOSIS — Z7952 Long term (current) use of systemic steroids: Secondary | ICD-10-CM | POA: Insufficient documentation

## 2015-12-08 LAB — BASIC METABOLIC PANEL
ANION GAP: 10 (ref 5–15)
BUN: 18 mg/dL (ref 6–20)
CO2: 25 mmol/L (ref 22–32)
Calcium: 9.3 mg/dL (ref 8.9–10.3)
Chloride: 106 mmol/L (ref 101–111)
Creatinine, Ser: 0.77 mg/dL (ref 0.44–1.00)
GFR calc Af Amer: >60 mL/min (ref >60)
Glucose, Bld: 94 mg/dL (ref 65–99)
POTASSIUM: 3.5 mmol/L (ref 3.5–5.1)
SODIUM: 141 mmol/L (ref 135–145)

## 2015-12-08 LAB — CBC
HEMATOCRIT: 38.2 % (ref 36.0–46.0)
HEMOGLOBIN: 12.9 g/dL (ref 12.0–15.0)
MCH: 30.4 pg (ref 26.0–34.0)
MCHC: 33.8 g/dL (ref 30.0–36.0)
MCV: 89.9 fL (ref 78.0–100.0)
Platelets: 246 10*3/uL (ref 150–400)
RBC: 4.25 MIL/uL (ref 3.87–5.11)
RDW: 12.7 % (ref 11.5–15.5)
WBC: 6.2 10*3/uL (ref 4.0–10.5)

## 2015-12-08 LAB — URINE MICROSCOPIC-ADD ON

## 2015-12-08 LAB — URINALYSIS, ROUTINE W REFLEX MICROSCOPIC
Bilirubin Urine: NEGATIVE
Glucose, UA: NEGATIVE mg/dL
Ketones, ur: NEGATIVE mg/dL
Nitrite: NEGATIVE
PROTEIN: NEGATIVE mg/dL
SPECIFIC GRAVITY, URINE: 1.004 — AB (ref 1.005–1.030)
pH: 6.5 (ref 5.0–8.0)

## 2015-12-08 MED ORDER — KETOROLAC TROMETHAMINE 15 MG/ML IJ SOLN
30.0000 mg | Freq: Once | INTRAMUSCULAR | Status: AC
  Administered 2015-12-08: 30 mg via INTRAVENOUS
  Filled 2015-12-08: qty 2

## 2015-12-08 MED ORDER — TRAMADOL HCL 50 MG PO TABS: 50.0000 mg | ORAL_TABLET | Freq: Four times a day (QID) | ORAL | Status: DC | PRN

## 2015-12-08 MED ORDER — IBUPROFEN 800 MG PO TABS: 800.0000 mg | ORAL_TABLET | Freq: Three times a day (TID) | ORAL | Status: AC

## 2015-12-08 NOTE — Discharge Instructions (Signed)
As discussed, your evaluation today has been largely reassuring.  But, it is important that you monitor your condition carefully, and do not hesitate to return to the ED if you develop new, or concerning changes in your condition. ? ?Otherwise, please follow-up with your physician for appropriate ongoing care. ? ?

## 2015-12-08 NOTE — ED Notes (Signed)
Ultrasound bedside.

## 2015-12-08 NOTE — ED Notes (Signed)
Patient here from home with complaints of lower left back pain radiating around in abd. "Feels like I have a kidney stone". Reports "urine dribbling". States it feels like she cant empty her bladder.

## 2015-12-08 NOTE — ED Provider Notes (Signed)
CSN: [id number]     Arrival date & time 12/08/15  1543 History   First MD Initiated Contact with Patient 12/08/15 1628     Chief Complaint  Patient presents with  . Flank Pain  . Abdominal Pain    HPI  Patient presents with concern of bilateral flank pain. Pain is more prominent in the left flank, though is persistent on both sides. Pain began a few days ago, has become worse since onset, is sore, severe, not improved with Tylenol, ibuprofen. The right-sided pain is nonradiating, focally in the flank. Left-sided pain radiated towards the inguinal area, left anterior proximal thigh. No associated fever, chills.  There is some oliguria.  Patient equates the symptoms with those she experienced with prior kidney stone.   Past Medical History  Diagnosis Date  . Fibromyalgia   . Sjogren's disease (HCC)   . Raynaud's disease   . Osteoarthritis   . AC (acromioclavicular) joint bone spurs   . Rheumatoid arthritis (HCC)   . Ovarian cyst   . IBS (irritable bowel syndrome)   . Kidney stones    Past Surgical History  Procedure Laterality Date  . C sections    . Cholecystectomy    . Endometrial ablation    . Lithotripsy     Family History  Problem Relation Age of Onset  . Hypertension Other   . Diabetes Other   . Cancer Other    Social History  Substance Use Topics  . Smoking status: Current Every Day Smoker -- 0.50 packs/day    Types: Cigarettes  . Smokeless tobacco: None  . Alcohol Use: No   OB History    No data available     Review of Systems  Constitutional:       Per HPI, otherwise negative  HENT:       Per HPI, otherwise negative  Respiratory:       Per HPI, otherwise negative  Cardiovascular:       Per HPI, otherwise negative  Gastrointestinal: Negative for vomiting.  Endocrine:       Negative aside from HPI  Genitourinary:       Neg aside from HPI   Musculoskeletal:       Per HPI, otherwise negative  Skin: Negative.   Neurological: Negative for  syncope.      Allergies  Review of patient's allergies indicates no known allergies.  Home Medications   Prior to Admission medications   Medication Sig Start Date End Date Taking? Authorizing Provider  Aspirin-Salicylamide-Caffeine (BC HEADACHE POWDER PO) Take 1 Package by mouth daily as needed (pain).   Yes Historical Provider, MD  ibuprofen (ADVIL,MOTRIN) 200 MG tablet Take 200 mg by mouth every 6 (six) hours as needed for mild pain or moderate pain.   Yes Historical Provider, MD  albuterol (PROVENTIL HFA;VENTOLIN HFA) 108 (90 Base) MCG/ACT inhaler Inhale 1-2 puffs into the lungs every 6 (six) hours as needed for wheezing or shortness of breath. Patient not taking: Reported on 12/08/2015 10/04/15   Junius Finner, PA-C  amoxicillin (AMOXIL) 500 MG capsule Take 1 capsule (500 mg total) by mouth 3 (three) times daily. Patient not taking: Reported on 08/05/2015 07/10/14   Kathrynn Speed, PA-C  azithromycin (ZITHROMAX) 250 MG tablet Take 1 tablet (250 mg total) by mouth daily. Take first 2 tablets together, then 1 every day until finished. Patient not taking: Reported on 12/08/2015 10/04/15   Junius Finner, PA-C  benzonatate (TESSALON) 100 MG capsule Take 2 capsules (200 mg  total) by mouth 2 (two) times daily as needed for cough. Patient not taking: Reported on 12/08/2015 09/30/15   Barrett Henle, PA-C  HYDROcodone-acetaminophen (NORCO/VICODIN) 5-325 MG per tablet Take 1-2 tablets by mouth every 4 (four) hours as needed. Patient not taking: Reported on 08/05/2015 07/10/14   Kathrynn Speed, PA-C  naproxen (NAPROSYN) 500 MG tablet Take 1 tablet (500 mg total) by mouth 2 (two) times daily. Patient not taking: Reported on 08/05/2015 11/12/14   Rolland Porter, MD  oxymetazoline [facility] NASAL SPRAY) 0.05 % nasal spray Place 1 spray into both nostrils 2 (two) times daily. Patient not taking: Reported on 12/08/2015 09/30/15   Barrett Henle, PA-C  predniSONE (DELTASONE) 20 MG tablet 3 tabs po day one, then  2 po daily x 4 days Patient not taking: Reported on 12/08/2015 10/04/15   Junius Finner, PA-C   BP 135/95 mmHg  Pulse 89  Temp(Src) 98 F (36.7 C) (Oral)  Resp 16  SpO2 100% Physical Exam  Constitutional: She is oriented to person, place, and time. She appears well-developed and well-nourished. No distress.  HENT:  Head: Normocephalic and atraumatic.  Eyes: Conjunctivae and EOM are normal.  Cardiovascular: Normal rate and regular rhythm.   Pulmonary/Chest: Effort normal and breath sounds normal. No stridor. No respiratory distress.  Abdominal: She exhibits no distension. There is no tenderness.  Musculoskeletal: She exhibits no edema.  Mild tenderness to palpation about the left CVA  Neurological: She is alert and oriented to person, place, and time. No cranial nerve deficit.  Skin: Skin is warm and dry.  Psychiatric: She has a normal mood and affect.  Nursing note and vitals reviewed.   ED Course  Procedures (including critical care time) Labs Review Labs Reviewed  URINALYSIS, ROUTINE W REFLEX MICROSCOPIC (NOT AT [facility]) - Abnormal; Notable for the following:    APPearance CLOUDY (*)    Specific Gravity, Urine 1.004 (*)    Hgb urine dipstick TRACE (*)    Leukocytes, UA SMALL (*)    All other components within normal limits  URINE MICROSCOPIC-ADD ON - Abnormal; Notable for the following:    Squamous Epithelial / LPF 6-30 (*)    Bacteria, UA RARE (*)    All other components within normal limits  BASIC METABOLIC PANEL  CBC    Imaging Review US Renal  12/08/2015  CLINICAL DATA:  Flank pain and hematuria EXAM: RENAL / URINARY TRACT ULTRASOUND COMPLETE COMPARISON:  CT abdomen and pelvis August 05, 2015 FINDINGS: Right Kidney: Length: 11.1 cm. Echogenicity within normal limits. Renal cortical thickness is low normal. No mass, perinephric fluid, or hydronephrosis visualized. No sonographically demonstrable calculus or ureterectasis. Left Kidney: Length: 12.8 cm. Echogenicity within  normal limits. Renal cortical thickness is low normal. No perinephric fluid or hydronephrosis visualized. There is a cyst in the upper pole region measuring 1.0 x 0.8 cm. There is a calculus in the upper pole of the left kidney measuring 7 mm. No sonographically demonstrable ureterectasis. Bladder: Appears normal for degree of bladder distention. IMPRESSION: Small cyst left kidney. Nonobstructing 7 mm calculus upper pole left kidney. Renal cortical thickness low normal bilaterally. Renal echogenicity normal bilaterally. No obstructing foci identified in either kidney. Electronically Signed   By: Bretta Bang III M.D.   On: 12/08/2015 18:13   I have personally reviewed and evaluated these images and lab results as part of my medical decision-making.  On repeat exam the patient is in no distress    MDM  Patient presents  with concern of bilateral flank pain, worse on the left. With history of prior stone, ultrasound was performed. Here the patient is awake, alert, hemodynamically stable, with no evidence for distress, peritonitis. Symptoms maybe secondary to musculoskeletal etiology, though there is demonstration of a kidney stone still in the left kidney. Patient had some improvement here, with no improvement for obstruction or infection, the patient was discharged in stable condition.  Gerhard Munch, MD 12/08/15 2009

## 2018-07-09 ENCOUNTER — Emergency Department (HOSPITAL_BASED_OUTPATIENT_CLINIC_OR_DEPARTMENT_OTHER)
Admission: EM | Admit: 2018-07-09 | Discharge: 2018-07-09 | Disposition: A | Discharge status: Home / Self Care | Payer: BLUE CROSS/BLUE SHIELD | Attending: Emergency Medicine | Admitting: Emergency Medicine

## 2018-07-09 ENCOUNTER — Emergency Department (HOSPITAL_BASED_OUTPATIENT_CLINIC_OR_DEPARTMENT_OTHER): Payer: BLUE CROSS/BLUE SHIELD

## 2018-07-09 ENCOUNTER — Encounter (HOSPITAL_BASED_OUTPATIENT_CLINIC_OR_DEPARTMENT_OTHER): Payer: Self-pay

## 2018-07-09 ENCOUNTER — Other Ambulatory Visit: Payer: Self-pay

## 2018-07-09 DIAGNOSIS — M069 Rheumatoid arthritis, unspecified: Secondary | ICD-10-CM | POA: Diagnosis not present

## 2018-07-09 DIAGNOSIS — R109 Unspecified abdominal pain: Secondary | ICD-10-CM | POA: Diagnosis present

## 2018-07-09 DIAGNOSIS — Z87891 Personal history of nicotine dependence: Secondary | ICD-10-CM | POA: Diagnosis not present

## 2018-07-09 DIAGNOSIS — N200 Calculus of kidney: Secondary | ICD-10-CM | POA: Insufficient documentation

## 2018-07-09 LAB — CBC
HCT: 39 % (ref 36.0–46.0)
Hemoglobin: 12.6 g/dL (ref 12.0–15.0)
MCH: 30.3 pg (ref 26.0–34.0)
MCHC: 32.3 g/dL (ref 30.0–36.0)
MCV: 93.8 fL (ref 80.0–100.0)
PLATELETS: 245 10*3/uL (ref 150–400)
RBC: 4.16 MIL/uL (ref 3.87–5.11)
RDW: 12.4 % (ref 11.5–15.5)
WBC: 5.5 10*3/uL (ref 4.0–10.5)
nRBC: 0 % (ref 0.0–0.2)

## 2018-07-09 LAB — COMPREHENSIVE METABOLIC PANEL
ALK PHOS: 51 U/L (ref 38–126)
ALT: 13 U/L (ref 0–44)
ANION GAP: 8 (ref 5–15)
AST: 17 U/L (ref 15–41)
Albumin: 4.1 g/dL (ref 3.5–5.0)
BUN: 15 mg/dL (ref 6–20)
CALCIUM: 9 mg/dL (ref 8.9–10.3)
CHLORIDE: 107 mmol/L (ref 98–111)
CO2: 23 mmol/L (ref 22–32)
CREATININE: 0.74 mg/dL (ref 0.44–1.00)
GFR calc non Af Amer: >60 mL/min (ref >60)
GLUCOSE: 92 mg/dL (ref 70–99)
Potassium: 3.7 mmol/L (ref 3.5–5.1)
Sodium: 138 mmol/L (ref 135–145)
Total Bilirubin: 0.2 mg/dL — ABNORMAL LOW (ref 0.3–1.2)
Total Protein: 6.7 g/dL (ref 6.5–8.1)

## 2018-07-09 LAB — URINALYSIS, ROUTINE W REFLEX MICROSCOPIC
Bilirubin Urine: NEGATIVE
Glucose, UA: NEGATIVE mg/dL
KETONES UR: NEGATIVE mg/dL
LEUKOCYTES UA: NEGATIVE
NITRITE: NEGATIVE
PROTEIN: NEGATIVE mg/dL
SPECIFIC GRAVITY, URINE: 1.01 (ref 1.005–1.030)
pH: 7 (ref 5.0–8.0)

## 2018-07-09 LAB — PREGNANCY, URINE: Preg Test, Ur: NEGATIVE

## 2018-07-09 LAB — URINALYSIS, MICROSCOPIC (REFLEX)

## 2018-07-09 LAB — LIPASE, BLOOD: Lipase: 45 U/L (ref 11–51)

## 2018-07-09 MED ORDER — HYDROMORPHONE HCL 1 MG/ML IJ SOLN
0.5000 mg | Freq: Once | INTRAMUSCULAR | Status: AC
  Administered 2018-07-09: 0.5 mg via INTRAVENOUS
  Filled 2018-07-09: qty 1

## 2018-07-09 MED ORDER — KETOROLAC TROMETHAMINE 15 MG/ML IJ SOLN
15.0000 mg | Freq: Once | INTRAMUSCULAR | Status: AC
  Administered 2018-07-09: 15 mg via INTRAVENOUS
  Filled 2018-07-09: qty 1

## 2018-07-09 MED ORDER — OXYCODONE HCL 5 MG PO TABS: 5.0000 mg | ORAL_TABLET | ORAL | 0 refills | Status: DC | PRN

## 2018-07-09 MED ORDER — SODIUM CHLORIDE 0.9 % IV BOLUS
1000.0000 mL | Freq: Once | INTRAVENOUS | Status: AC
  Administered 2018-07-09: 1000 mL via INTRAVENOUS

## 2018-07-09 NOTE — Discharge Instructions (Signed)
You were evaluated in the Emergency Department and after careful evaluation, we did not find any emergent condition requiring admission or further testing in the hospital.  Your symptoms today seem to be due to a kidney stone.  As discussed, please follow-up with your urologist.  You can use the oxycodone prescription provided as needed for pain.  Please return to the Emergency Department if you experience any worsening of your condition.  We encourage you to follow up with a primary care provider.  Thank you for allowing [place] to be a part of your care.

## 2018-07-09 NOTE — ED Triage Notes (Signed)
C/o left flank pain x 2 days-states feels like kidney stone-NAD-steady gait

## 2018-07-09 NOTE — ED Notes (Signed)
C/o left flank pain x 2-3 days  Worse today  Felt like ? Stone moving

## 2018-07-09 NOTE — ED Provider Notes (Signed)
[facility] Emergency Department Provider Note MRN:  [id number]  Arrival date & time: 07/09/18     Chief Complaint   Flank Pain   History of Present Illness   Morgan James is a 47 y.o. year-old female with a history of kidney stones, fibromyalgia, Sjogren's disease presenting to the ED with chief complaint of flank pain.  Pain is located in the left flank, rating to the left lower quadrant.  Pain present for 2 days, gradually worsening, now 8 out of 10 in severity, feels similar to prior kidney stones.  Denies fever, no dysuria, no hematuria, no chest pain, shortness of breath.  No exacerbating or alleviating factors.  Review of Systems  A complete 10 system review of systems was obtained and all systems are negative except as noted in the HPI and PMH.   Patient's Health History    Past Medical History:  Diagnosis Date  . AC (acromioclavicular) joint bone spurs   . Fibromyalgia   . IBS (irritable bowel syndrome)   . Kidney stones   . Osteoarthritis   . Ovarian cyst   . Raynaud's disease   . Rheumatoid arthritis (HCC)   . Sjogren's disease [facility])     Past Surgical History:  Procedure Laterality Date  . c sections    . CHOLECYSTECTOMY    . ENDOMETRIAL ABLATION    . LITHOTRIPSY      Family History  Problem Relation Age of Onset  . Hypertension Other   . Diabetes Other   . Cancer Other     Social History   Socioeconomic History  . Marital status: Married    Spouse name: Not on file  . Number of children: Not on file  . Years of education: Not on file  . Highest education level: Not on file  Occupational History  . Not on file  Social Needs  . Financial resource strain: Not on file  . Food insecurity:    Worry: Not on file    Inability: Not on file  . Transportation needs:    Medical: Not on file    Non-medical: Not on file  Tobacco Use  . Smoking status: Former Smoker    Packs/day: 0.00  . Smokeless tobacco: Never Used    Substance and Sexual Activity  . Alcohol use: No  . Drug use: No  . Sexual activity: Not on file  Lifestyle  . Physical activity:    Days per week: Not on file    Minutes per session: Not on file  . Stress: Not on file  Relationships  . Social connections:    Talks on phone: Not on file    Gets together: Not on file    Attends religious service: Not on file    Active member of club or organization: Not on file    Attends meetings of clubs or organizations: Not on file    Relationship status: Not on file  . Intimate partner violence:    Fear of current or ex partner: Not on file    Emotionally abused: Not on file    Physically abused: Not on file    Forced sexual activity: Not on file  Other Topics Concern  . Not on file  Social History Narrative  . Not on file     Physical Exam  Vital Signs and Nursing Notes reviewed Vitals:   07/09/18 1936  BP: 123/85  Pulse: 68  Resp: 18  Temp: 98.1 F (36.7 C)  SpO2: 100%    CONSTITUTIONAL: Well-appearing, NAD NEURO:  Alert and oriented x 3, no focal deficits EYES:  eyes equal and reactive ENT/NECK:  no LAD, no JVD CARDIO: Regular rate, well-perfused, normal S1 and S2 PULM:  CTAB no wheezing or rhonchi GI/GU:  normal bowel sounds, non-distended, non-tender; left CVA tenderness MSK/SPINE:  No gross deformities, no edema SKIN:  no rash, atraumatic PSYCH:  Appropriate speech and behavior  Diagnostic and Interventional Summary    Labs Reviewed  URINALYSIS, ROUTINE W REFLEX MICROSCOPIC - Abnormal; Notable for the following components:      Result Value   Color, Urine STRAW (*)    Hgb urine dipstick TRACE (*)    All other components within normal limits  URINALYSIS, MICROSCOPIC (REFLEX) - Abnormal; Notable for the following components:   Bacteria, UA RARE (*)    All other components within normal limits  COMPREHENSIVE METABOLIC PANEL - Abnormal; Notable for the following components:   Total Bilirubin 0.2 (*)    All other  components within normal limits  PREGNANCY, URINE  CBC  LIPASE, BLOOD    CT Renal Stone Study  Final Result      Medications  ketorolac (TORADOL) 15 MG/ML injection 15 mg (15 mg Intravenous Given 07/09/18 2025)  sodium chloride 0.9 % bolus 1,000 mL (1,000 mLs Intravenous New Bag/Given 07/09/18 2024)  HYDROmorphone (DILAUDID) injection 0.5 mg (0.5 mg Intravenous Given 07/09/18 2028)     Procedures Critical Care  ED Course and Medical Decision Making  I have reviewed the triage vital signs and the nursing notes.  Pertinent labs & imaging results that were available during my care of the patient were reviewed by me and considered in my medical decision making (see below for details).  Suspect recurrent kidney stone, CT to confirm.  Patient has had large stones in the past that needed procedures for removal, will CT to determine size.  1 mm kidney stone, should be easily passed.  Patient is feeling well enough to be discharged, no AKI, no evidence of infection in the urine.  We will follow-up with her urologist.  After the discussed management above, the patient was determined to be safe for discharge.  The patient was in agreement with this plan and all questions regarding their care were answered.  ED return precautions were discussed and the patient will return to the ED with any significant worsening of condition.  Elmer Sow. Pilar Plate, MD [facility] Emergency Medicine [facility] [email or web address]  Final Clinical Impressions(s) / ED Diagnoses     ICD-10-CM   1. Left flank pain R10.9   2. Kidney stone N20.0     ED Discharge Orders         Ordered    oxyCODONE (ROXICODONE) 5 MG immediate release tablet  Every 4 hours PRN     07/09/18 2257             Sabas Sous, MD 07/09/18 2259

## 2018-07-24 ENCOUNTER — Encounter (HOSPITAL_BASED_OUTPATIENT_CLINIC_OR_DEPARTMENT_OTHER): Payer: BLUE CROSS/BLUE SHIELD

## 2018-12-31 ENCOUNTER — Telehealth: Payer: Self-pay | Admitting: General Practice

## 2018-12-31 NOTE — Telephone Encounter (Signed)
Received online submission request from pt to become a new pt w/Dr. Drue Novel.  Called pt and LMOVM stating we cannot accept her as a NP d/t Dr. Jonny Ruiz dismissing her from the practice in 2011.

## 2019-02-19 ENCOUNTER — Ambulatory Visit (HOSPITAL_COMMUNITY): Payer: BC Managed Care – PPO | Admitting: Licensed Clinical Social Worker

## 2020-01-18 IMAGING — CT CT RENAL STONE PROTOCOL
2 of 4 series · 16 of 46 positions shown, 18 images · non-contrast
Comparison: 08/05/2015

CLINICAL DATA: Left flank pain 2-3 days

EXAM:
CT ABDOMEN AND PELVIS WITHOUT CONTRAST
TECHNIQUE: Multidetector CT imaging of the abdomen and pelvis was performed
following the standard protocol without IV contrast.

[Series 2: axial st · axial · 0.73mm/px · z∈[-730,-345]mm · 13 of 85 slices shown, 15 images]
[im 4/85  soft-tissue]
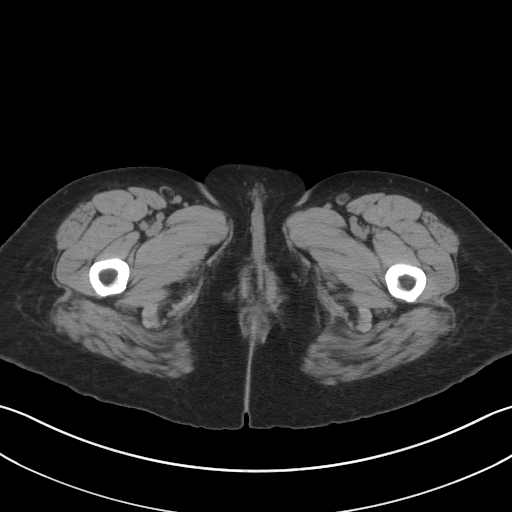
[im 4/85  bone]
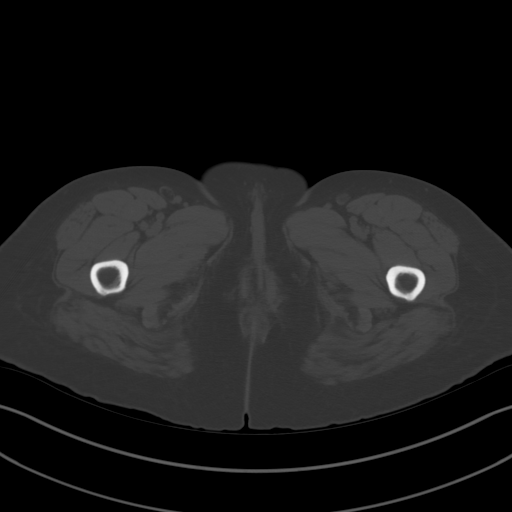
[im 12/85  soft-tissue]
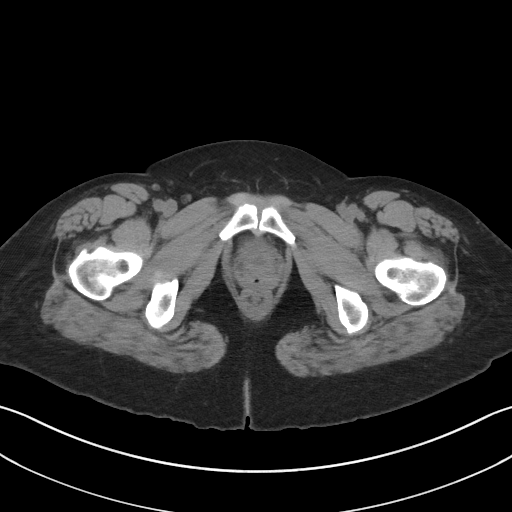
[im 20/85  soft-tissue]
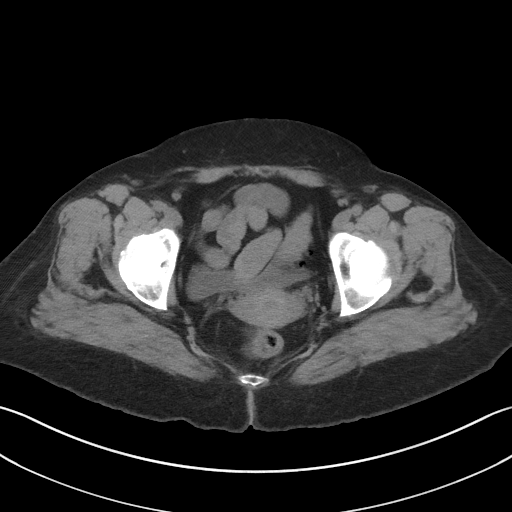
[im 23/85  soft-tissue]
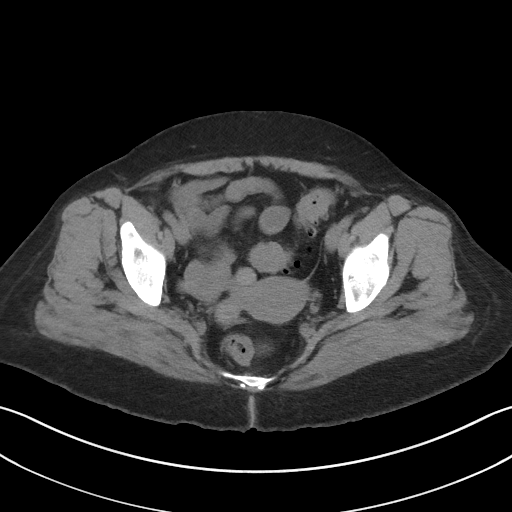
[im 31/85  soft-tissue]
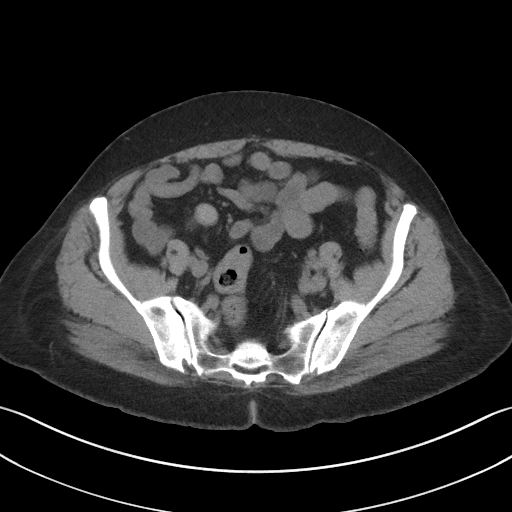
[im 35/85  soft-tissue]
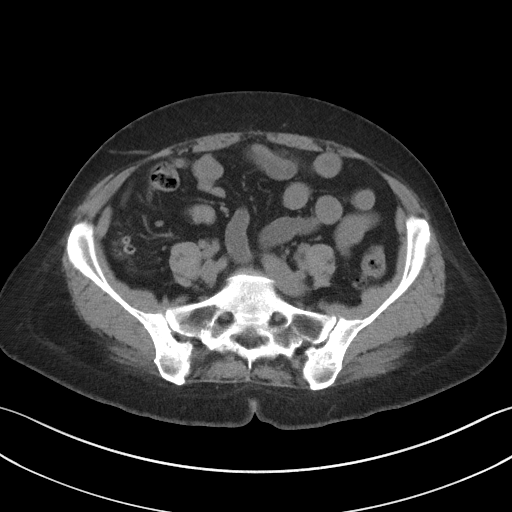
[im 43/85  soft-tissue]
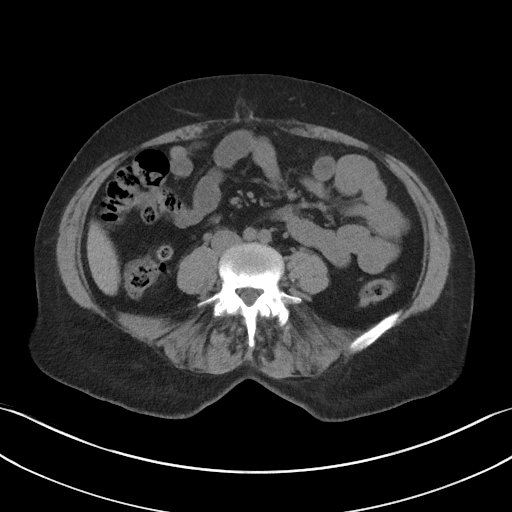
[im 50/85  soft-tissue]
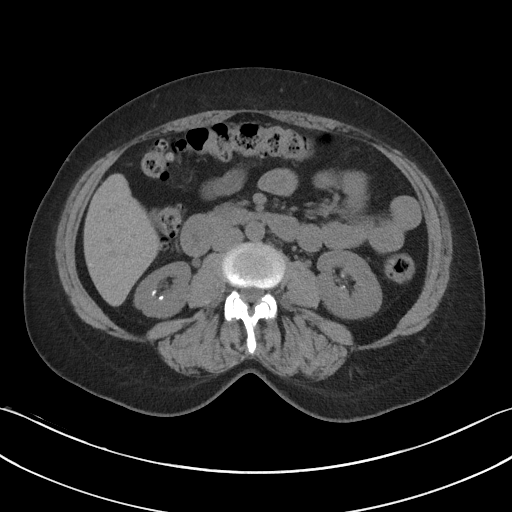
[im 54/85  soft-tissue]
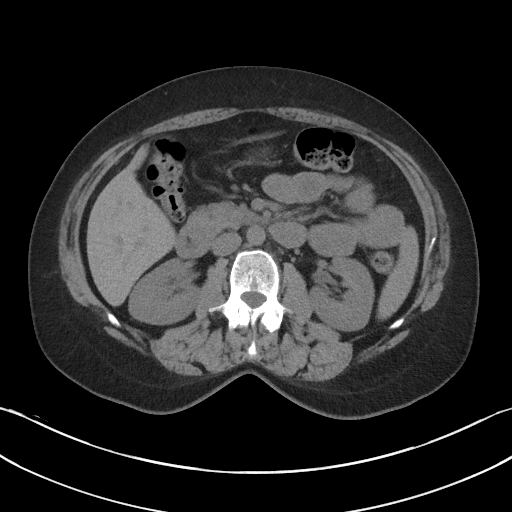
[im 54/85  bone]
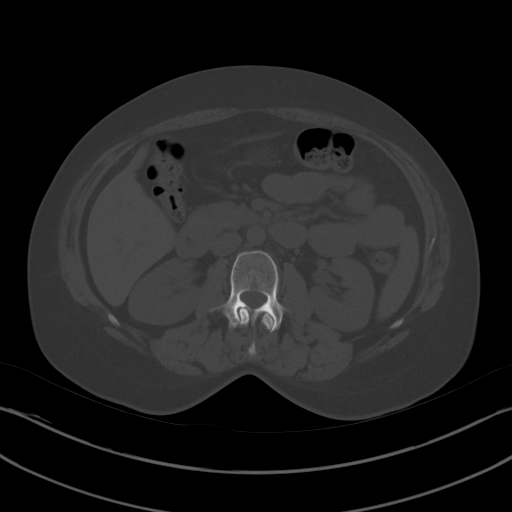
[im 62/85  soft-tissue]
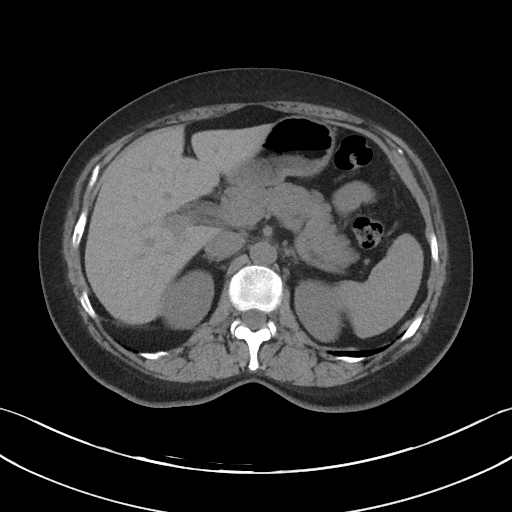
[im 65/85  soft-tissue]
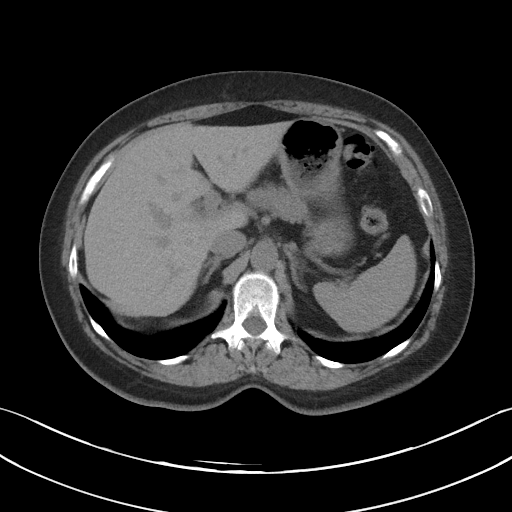
[im 73/85  soft-tissue]
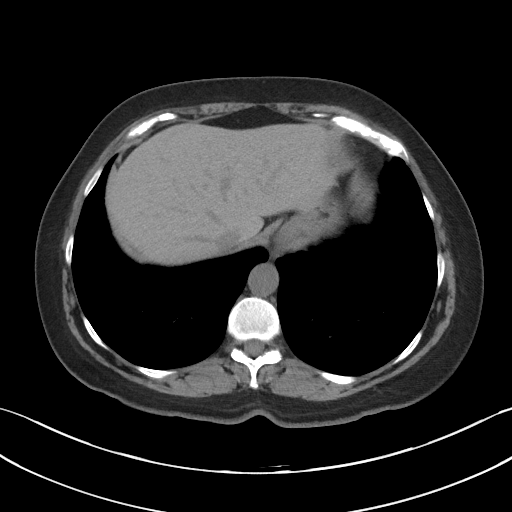
[im 81/85  soft-tissue]
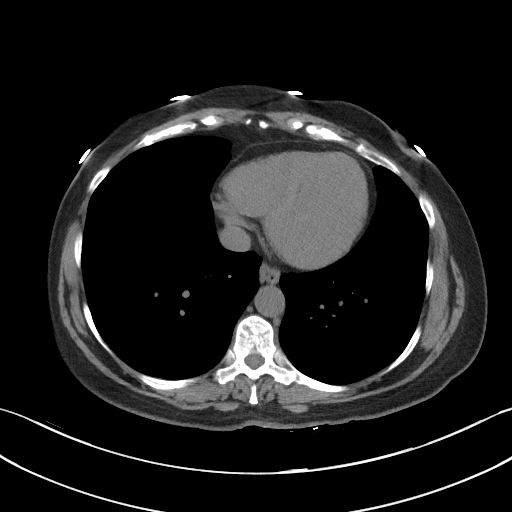

[Series 5: coronal st · coronal · 0.68mm/px · 3 of 93 slices shown]
[im 31/93  soft-tissue]
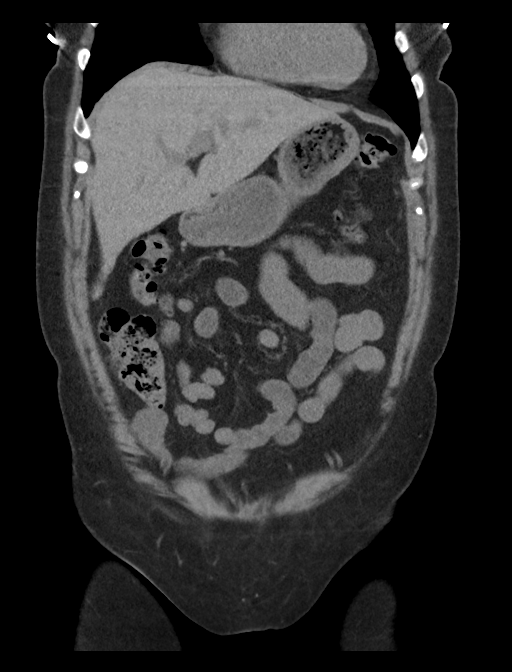
[im 41/93  soft-tissue]
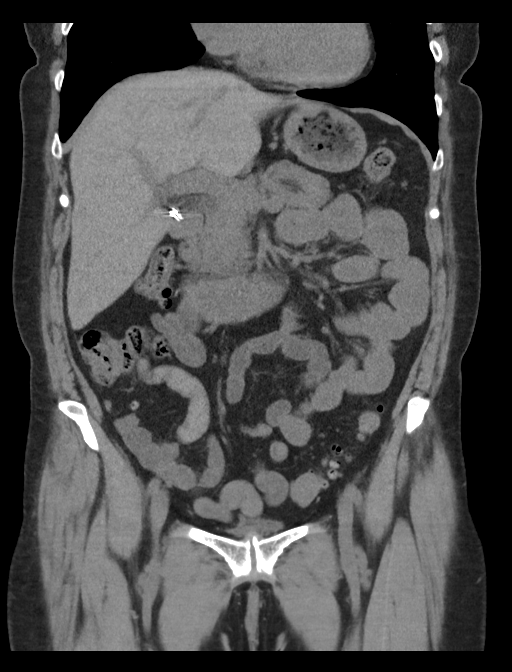
[im 52/93  soft-tissue]
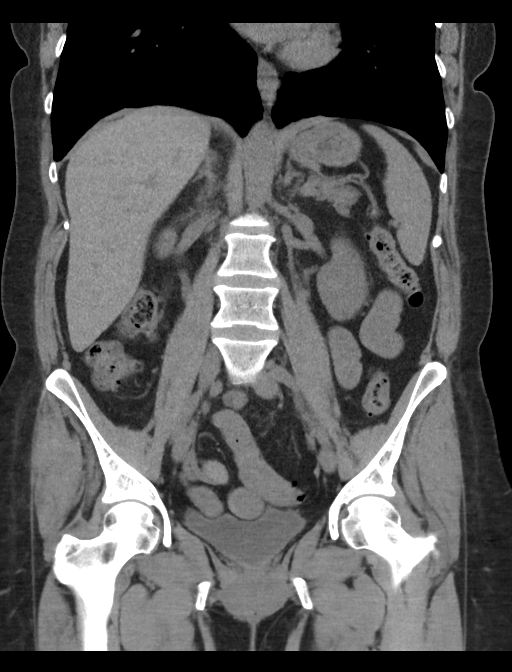

[16 of 46 positions shown; findings below may reference images not displayed]

FINDINGS: Lower chest: No acute abnormality.

Hepatobiliary: No focal liver abnormality is seen. Status post
cholecystectomy. No biliary dilatation.

Pancreas: Unremarkable. No pancreatic ductal dilatation or
surrounding inflammatory changes.

Spleen: Normal in size without focal abnormality.

Adrenals/Urinary Tract: Normal adrenal glands. Bilateral
nonobstructing renal calculi. 1 mm left UVJ calculus resulting in
minimal left hydroureteronephrosis. No other ureteral calculi.
Normal bladder.

Stomach/Bowel: Stomach is within normal limits. Appendix appears
normal. No evidence of bowel wall thickening, distention, or
inflammatory changes. Diverticulosis without evidence of
diverticulitis. No pneumatosis, pneumoperitoneum or portal venous
gas.

Vascular/Lymphatic: No significant vascular findings are present. No
enlarged abdominal or pelvic lymph nodes.

Reproductive: Uterus and bilateral adnexa are unremarkable.

Other: Small fat containing umbilical hernia.  No ascites.

Musculoskeletal: No acute osseous abnormality. No aggressive osseous
lesion. Mild osteoarthritis of bilateral sacroiliac joints. Severe
left and mild right facet arthropathy at L5-S1.
IMPRESSION: 1. There is a 1 mm left UVJ calculus resulting in minimal left
hydroureteronephrosis.
2. Numerous nonobstructing bilateral nephrolithiasis.

## 2021-09-25 DIAGNOSIS — Z0000 Encounter for general adult medical examination without abnormal findings: Secondary | ICD-10-CM | POA: Diagnosis not present

## 2021-09-25 DIAGNOSIS — R5383 Other fatigue: Secondary | ICD-10-CM | POA: Diagnosis not present

## 2021-09-25 DIAGNOSIS — R030 Elevated blood-pressure reading, without diagnosis of hypertension: Secondary | ICD-10-CM | POA: Diagnosis not present

## 2021-09-25 DIAGNOSIS — Z1159 Encounter for screening for other viral diseases: Secondary | ICD-10-CM | POA: Diagnosis not present

## 2021-09-25 DIAGNOSIS — E559 Vitamin D deficiency, unspecified: Secondary | ICD-10-CM | POA: Diagnosis not present

## 2021-10-27 DIAGNOSIS — M25511 Pain in right shoulder: Secondary | ICD-10-CM | POA: Diagnosis not present

## 2021-12-07 DIAGNOSIS — J0110 Acute frontal sinusitis, unspecified: Secondary | ICD-10-CM | POA: Diagnosis not present

## 2022-01-04 DIAGNOSIS — R69 Illness, unspecified: Secondary | ICD-10-CM | POA: Diagnosis not present

## 2022-01-04 DIAGNOSIS — F419 Anxiety disorder, unspecified: Secondary | ICD-10-CM | POA: Diagnosis not present

## 2022-01-08 ENCOUNTER — Emergency Department (HOSPITAL_BASED_OUTPATIENT_CLINIC_OR_DEPARTMENT_OTHER)
Admission: EM | Admit: 2022-01-08 | Discharge: 2022-01-08 | Disposition: A | Discharge status: Home / Self Care | Payer: Self-pay | Attending: Emergency Medicine | Admitting: Emergency Medicine

## 2022-01-08 ENCOUNTER — Other Ambulatory Visit: Payer: Self-pay

## 2022-01-08 ENCOUNTER — Emergency Department (HOSPITAL_BASED_OUTPATIENT_CLINIC_OR_DEPARTMENT_OTHER): Payer: BLUE CROSS/BLUE SHIELD | Admitting: Radiology

## 2022-01-08 ENCOUNTER — Emergency Department (HOSPITAL_BASED_OUTPATIENT_CLINIC_OR_DEPARTMENT_OTHER): Payer: Self-pay

## 2022-01-08 ENCOUNTER — Encounter (HOSPITAL_BASED_OUTPATIENT_CLINIC_OR_DEPARTMENT_OTHER): Payer: Self-pay

## 2022-01-08 DIAGNOSIS — R200 Anesthesia of skin: Secondary | ICD-10-CM | POA: Insufficient documentation

## 2022-01-08 DIAGNOSIS — M542 Cervicalgia: Secondary | ICD-10-CM | POA: Insufficient documentation

## 2022-01-08 DIAGNOSIS — Z87891 Personal history of nicotine dependence: Secondary | ICD-10-CM | POA: Insufficient documentation

## 2022-01-08 DIAGNOSIS — R0789 Other chest pain: Secondary | ICD-10-CM | POA: Insufficient documentation

## 2022-01-08 LAB — BASIC METABOLIC PANEL
Anion gap: 13 (ref 5–15)
BUN: 9 mg/dL (ref 6–20)
CO2: 24 mmol/L (ref 22–32)
Calcium: 10.4 mg/dL — ABNORMAL HIGH (ref 8.9–10.3)
Chloride: 102 mmol/L (ref 98–111)
Creatinine, Ser: 0.78 mg/dL (ref 0.44–1.00)
GFR, Estimated: >60 mL/min (ref >60)
Glucose, Bld: 111 mg/dL — ABNORMAL HIGH (ref 70–99)
Potassium: 3.9 mmol/L (ref 3.5–5.1)
Sodium: 139 mmol/L (ref 135–145)

## 2022-01-08 LAB — CBC
HCT: 45.2 % (ref 36.0–46.0)
Hemoglobin: 15.1 g/dL — ABNORMAL HIGH (ref 12.0–15.0)
MCH: 30.4 pg (ref 26.0–34.0)
MCHC: 33.4 g/dL (ref 30.0–36.0)
MCV: 90.9 fL (ref 80.0–100.0)
Platelets: 317 10*3/uL (ref 150–400)
RBC: 4.97 MIL/uL (ref 3.87–5.11)
RDW: 12.2 % (ref 11.5–15.5)
WBC: 6.4 10*3/uL (ref 4.0–10.5)
nRBC: 0 % (ref 0.0–0.2)

## 2022-01-08 LAB — TROPONIN I (HIGH SENSITIVITY): Troponin I (High Sensitivity): 2 ng/L (ref <18)

## 2022-01-08 MED ORDER — NAPROXEN 500 MG PO TABS: 500.0000 mg | ORAL_TABLET | Freq: Two times a day (BID) | ORAL | 0 refills | Status: DC

## 2022-01-08 MED ORDER — METHOCARBAMOL 500 MG PO TABS: 500.0000 mg | ORAL_TABLET | Freq: Two times a day (BID) | ORAL | 0 refills | Status: DC

## 2022-01-08 MED ORDER — KETOROLAC TROMETHAMINE 15 MG/ML IJ SOLN
15.0000 mg | Freq: Once | INTRAMUSCULAR | Status: AC
  Administered 2022-01-08: 15 mg via INTRAVENOUS
  Filled 2022-01-08: qty 1

## 2022-01-08 NOTE — ED Triage Notes (Signed)
"  Numbness in my head, tightness in my upper center chest, stiffness in neck for several weeks, but it is worse today. I went to my doctor last week and she thought it was tension headaches and she gave me atarax and cymbalta 4 days ago, and it has not helped yet. I feel anxious because I am scared something is wrong. A few weeks ago, I got hot and sweaty and passed out and EMS came out and checked my vitals but they were fine so I did not go to ED" per patient

## 2022-01-08 NOTE — ED Notes (Signed)
Patient verbalizes understanding of discharge instructions. Opportunity for questioning and answers were provided. Patient discharged from ED.  °

## 2022-01-08 NOTE — Discharge Instructions (Addendum)
Your work-up today was very reassuring.  Your EKG, chest x-[place] and labs were all normal.  The enzyme that we check to evaluate your heart was also normal.  I think a lot of the symptoms you are experiencing are musculoskeletal in nature.  I have sent you in a muscle relaxer and a prescription strength NSAID for you to use for comfort.  Please note that the muscle relaxer can often make you drowsy so trial this at nighttime the first time to see how you respond.  I have also given you referral to orthopedics.  Please follow-up with them for better evaluation.

## 2022-01-08 NOTE — ED Provider Notes (Signed)
[facility] EMERGENCY DEPT Provider Note   CSN: [id number] Arrival date & time: 01/08/22  1232     History  Chief Complaint  Patient presents with   Chest Pain    Morgan James is a 51 y.o. female who presents to the ED for evaluation of chest tightness that started today.  Patient states that she has been experiencing headache and numbness in her head for several weeks.  She has a history with neck muscle spasms and pain, so she went to her doctor where she believes symptoms were secondary to tension headaches.  She was given Atarax and Cymbalta which she started 4 days ago.  Since she started the Cymbalta, she has been feeling very "fuzzy" in her head.  She continues to have a headache and numbness at the top of her head.  What changed today was that she developed chest pain across her sternum that she describes as "like pulling a muscle".  Patient is a non-smoker with no previous cardiac history.  No immediate family history of cardiac disease.  Of note, patient states she had an episode a few weeks ago where she became diaphoretic and hot and passed out.  She called EMS and her vitals were fine so she did not come to the emergency department.  Currently, she denies diaphoresis, abdominal pain, nausea, vomiting, diarrhea, leg swelling and shortness of breath.   Chest Pain      Home Medications Prior to Admission medications   Medication Sig Start Date End Date Taking? Authorizing Provider  albuterol (PROVENTIL HFA;VENTOLIN HFA) 108 (90 Base) MCG/ACT inhaler Inhale 1-2 puffs into the lungs every 6 (six) hours as needed for wheezing or shortness of breath. Patient not taking: Reported on 12/08/2015 10/04/15   Noe Gens, PA-C  Aspirin-Salicylamide-Caffeine (BC HEADACHE POWDER PO) Take 1 Package by mouth daily as needed (pain).    [provider]  methocarbamol (ROBAXIN) 500 MG tablet Take 1 tablet (500 mg total) by mouth 2 (two) times daily. 01/08/22   Tonye Pearson, PA-C  naproxen (NAPROSYN) 500 MG tablet Take 1 tablet (500 mg total) by mouth 2 (two) times daily. 01/08/22   Tonye Pearson, PA-C  oxyCODONE (ROXICODONE) 5 MG immediate release tablet Take 1 tablet (5 mg total) by mouth every 4 (four) hours as needed for severe pain. 07/09/18   Maudie Flakes, MD  traMADol (ULTRAM) 50 MG tablet Take 1 tablet (50 mg total) by mouth every 6 (six) hours as needed. 12/08/15   Carmin Muskrat, MD      Allergies    Patient has no known allergies.    Review of Systems   Review of Systems  Cardiovascular:  Positive for chest pain.    Physical Exam Updated Vital Signs BP 120/77   Pulse 75   Temp 98.2 F (36.8 C)   Resp 20   Ht 5\' 2"  (1.575 m)   Wt 66.2 kg   SpO2 98%   BMI 26.70 kg/m  Physical Exam Vitals and nursing note reviewed.  Constitutional:      General: She is not in acute distress.    Appearance: She is not ill-appearing.  HENT:     Head: Atraumatic.  Eyes:     Conjunctiva/sclera: Conjunctivae normal.  Neck:     Vascular: No JVD.      Comments: Bilateral paraspinal C-spine tenderness.  Full range of motion. Cardiovascular:     Rate and Rhythm: Normal rate and regular rhythm.  Pulses: Normal pulses.          Radial pulses are 2+ on the right side and 2+ on the left side.       Dorsalis pedis pulses are 2+ on the right side and 2+ on the left side.     Heart sounds: No murmur heard. Pulmonary:     Effort: Pulmonary effort is normal. No respiratory distress.     Breath sounds: Normal breath sounds.  Abdominal:     General: Abdomen is flat. There is no distension.     Palpations: Abdomen is soft.     Tenderness: There is no abdominal tenderness.  Musculoskeletal:        General: Normal range of motion.     Cervical back: Normal range of motion.     Right lower leg: No edema.     Left lower leg: No edema.  Skin:    General: Skin is warm and dry.     Capillary Refill: Capillary refill takes less than 2 seconds.   Neurological:     General: No focal deficit present.     Mental Status: She is alert.     Comments: Speech is clear, able to follow commands  Normal strength in upper and lower extremities bilaterally including dorsiflexion and plantar flexion, strong and equal grip strength Subjective sensation intact bilaterally Moves extremities without ataxia, coordination intact  No pronator drift    Psychiatric:        Mood and Affect: Mood normal.     ED Results / Procedures / Treatments   Labs (all labs ordered are listed, but only abnormal results are displayed) Labs Reviewed  BASIC METABOLIC PANEL - Abnormal; Notable for the following components:      Result Value   Glucose, Bld 111 (*)    Calcium 10.4 (*)    All other components within normal limits  CBC - Abnormal; Notable for the following components:   Hemoglobin 15.1 (*)    All other components within normal limits  TROPONIN I (HIGH SENSITIVITY)    EKG EKG Interpretation  Date/Time:  Monday January 08 2022 12:42:18 EDT Ventricular Rate:  95 PR Interval:  148 QRS Duration: 76 QT Interval:  334 QTC Calculation: 419 R Axis:   13 Text Interpretation: Normal sinus rhythm with sinus arrhythmia Right atrial enlargement Borderline ECG Confirmed by Regan Lemming ([id number]) on 01/08/2022 1:50:52 PM  Radiology DG Chest 2 View  Result Date: 01/08/2022 CLINICAL DATA:  Chest tightness with head numbness since starting new medication. EXAM: CHEST - 2 VIEW COMPARISON:  Radiographs 02/21/2012 and 05/19/2008. FINDINGS: The heart size and mediastinal contours are normal. The lungs are clear. There is no pleural effusion or pneumothorax. No acute osseous findings are identified. Cholecystectomy clips are noted. IMPRESSION: No evidence of active cardiopulmonary process. Electronically Signed   By: Richardean Sale M.D.   On: 01/08/2022 13:44   CT Head Wo Contrast  Result Date: 01/08/2022 CLINICAL DATA:  Headache with numbness for 3-4 days after  starting a new medication. History of fibromyalgia. EXAM: CT HEAD WITHOUT CONTRAST TECHNIQUE: Contiguous axial images were obtained from the base of the skull through the vertex without intravenous contrast. RADIATION DOSE REDUCTION: This exam was performed according to the departmental dose-optimization program which includes automated exposure control, adjustment of the mA and/or kV according to patient size and/or use of iterative reconstruction technique. COMPARISON:  CT head 11/06/2007 FINDINGS: Brain: There is no evidence of acute intracranial hemorrhage, mass lesion, brain edema or  extra-axial fluid collection. The ventricles and subarachnoid spaces are appropriately sized for age. There is no CT evidence of acute cortical infarction. Vascular:  No hyperdense vessel identified. Skull: Negative for fracture or focal lesion. Sinuses/Orbits: The visualized paranasal sinuses and mastoid air cells are clear. No orbital abnormalities are seen. Other: None. IMPRESSION: Stable unremarkable noncontrast head CT. Electronically Signed   By: Richardean Sale M.D.   On: 01/08/2022 13:42    Procedures Procedures    Medications Ordered in ED Medications  ketorolac (TORADOL) 15 MG/ML injection 15 mg (15 mg Intravenous Given 01/08/22 1600)    ED Course/ Medical Decision Making/ A&P                           Medical Decision Making Amount and/or Complexity of Data Reviewed Labs: ordered. Radiology: ordered.  Risk Prescription drug management.   Social determinants of health:  Social History   Socioeconomic History   Marital status: Married    Spouse name: Not on file   Number of children: Not on file   Years of education: Not on file   Highest education level: Not on file  Occupational History   Not on file  Tobacco Use   Smoking status: Former    Packs/day: 0.00    Types: Cigarettes   Smokeless tobacco: Never  Vaping Use   Vaping Use: Never used  Substance and Sexual Activity   Alcohol  use: No   Drug use: No   Sexual activity: Not on file  Other Topics Concern   Not on file  Social History Narrative   Not on file   Social Determinants of Health   Financial Resource Strain: Not on file  Food Insecurity: Not on file  Transportation Needs: Not on file  Physical Activity: Not on file  Stress: Not on file  Social Connections: Not on file  Intimate Partner Violence: Not on file     Initial impression:  This patient presents to the ED for concern of chest pain, this involves an extensive number of treatment options, and is a complaint that carries with it a high risk of complications and morbidity.   Differentials include ACS, PE, musculoskeletal, costochondritis, CVA, pneumonia  Comorbidities affecting care:  Hypertension, anxiety  Additional history obtained: Husband  Lab Tests  I Ordered, reviewed, and interpreted labs and EKG.  The pertinent results include:  BMP, CBC normal Troponin normal  Imaging Studies ordered:  I ordered imaging studies including  CT head without contrast unremarkable Chest x-Hoshino unremarkable I independently visualized and interpreted imaging and I agree with the radiologist interpretation.   EKG: Sinus rhythm  Cardiac Monitoring:  The patient was maintained on a cardiac monitor.  I personally viewed and interpreted the cardiac monitored which showed an underlying rhythm of: Sinus rhythm   Medicines ordered and prescription drug management:  I ordered medication including: Toradol 15 mg IV Reevaluation of the patient after these medicines showed that the patient improved I have reviewed the patients home medicines and have made adjustments as needed  ED Course/Re-evaluation: 51 year old female presents emergency department for evaluation of chest pain and numbness at the top of her head.  Vitals are without significant abnormality.  Physical exam benign although patient does have bilateral paraspinal C-spine tenderness  extending down into the trapezius.  Symptoms not consistent with CVA and CT head was negative.  Labs were also reassuring and troponin was negative.  EKG normal.  Chest x-Doebler without  acute findings.  After discussion with patient, she believes that all of her symptoms are related to her ongoing neck pain.  She states that her pain seems to start there and radiate up causing her headache and the occasional numbness and was extended down and causing pain across her pectorals.  The "fuzzy" sensation she describes can be attributed to her new Cymbalta use.  Overall, patient's symptoms are likely musculoskeletal.  She was given Toradol 15 mg with some improvement in symptoms.  Discharged home with naproxen, Robaxin and advised to follow-up with PCP.  Patient expresses understanding of medical plan  Disposition:  After consideration of the diagnostic results, physical exam, history and the patients response to treatment feel that the patent would benefit from discharge.   Musculoskeletal chest pain Neck pain: Plan and management as described above. Discharged home in good condition. Final Clinical Impression(s) / ED Diagnoses Final diagnoses:  Musculoskeletal chest pain  Neck pain    Rx / DC Orders ED Discharge Orders          Ordered    naproxen (NAPROSYN) 500 MG tablet  2 times daily,   Status:  Discontinued        01/08/22 1701    methocarbamol (ROBAXIN) 500 MG tablet  2 times daily,   Status:  Discontinued        01/08/22 1701    methocarbamol (ROBAXIN) 500 MG tablet  2 times daily        01/08/22 1714    naproxen (NAPROSYN) 500 MG tablet  2 times daily        01/08/22 1714              Rodena Piety 01/08/22 2334    Regan Lemming, MD 01/08/22 2351

## 2022-02-16 DIAGNOSIS — F419 Anxiety disorder, unspecified: Secondary | ICD-10-CM | POA: Diagnosis not present

## 2022-02-16 DIAGNOSIS — R69 Illness, unspecified: Secondary | ICD-10-CM | POA: Diagnosis not present

## 2022-02-23 ENCOUNTER — Other Ambulatory Visit: Payer: Self-pay | Admitting: Family Medicine

## 2022-02-23 DIAGNOSIS — Z1231 Encounter for screening mammogram for malignant neoplasm of breast: Secondary | ICD-10-CM

## 2022-05-28 DIAGNOSIS — R69 Illness, unspecified: Secondary | ICD-10-CM | POA: Diagnosis not present

## 2022-05-28 DIAGNOSIS — R5383 Other fatigue: Secondary | ICD-10-CM | POA: Diagnosis not present

## 2022-10-30 ENCOUNTER — Encounter (HOSPITAL_BASED_OUTPATIENT_CLINIC_OR_DEPARTMENT_OTHER): Payer: Self-pay

## 2022-10-30 ENCOUNTER — Emergency Department (HOSPITAL_BASED_OUTPATIENT_CLINIC_OR_DEPARTMENT_OTHER)
Admission: EM | Admit: 2022-10-30 | Discharge: 2022-10-31 | Disposition: A | Discharge status: Home / Self Care | Payer: PRIVATE HEALTH INSURANCE | Attending: Emergency Medicine | Admitting: Emergency Medicine

## 2022-10-30 ENCOUNTER — Other Ambulatory Visit: Payer: Self-pay

## 2022-10-30 ENCOUNTER — Emergency Department (HOSPITAL_BASED_OUTPATIENT_CLINIC_OR_DEPARTMENT_OTHER): Payer: PRIVATE HEALTH INSURANCE

## 2022-10-30 DIAGNOSIS — Z7982 Long term (current) use of aspirin: Secondary | ICD-10-CM | POA: Diagnosis not present

## 2022-10-30 DIAGNOSIS — N132 Hydronephrosis with renal and ureteral calculous obstruction: Secondary | ICD-10-CM | POA: Insufficient documentation

## 2022-10-30 DIAGNOSIS — N200 Calculus of kidney: Secondary | ICD-10-CM

## 2022-10-30 DIAGNOSIS — R109 Unspecified abdominal pain: Secondary | ICD-10-CM | POA: Diagnosis present

## 2022-10-30 LAB — URINALYSIS, ROUTINE W REFLEX MICROSCOPIC
Bacteria, UA: NONE SEEN
Bilirubin Urine: NEGATIVE
Glucose, UA: NEGATIVE mg/dL
Ketones, ur: NEGATIVE mg/dL
Leukocytes,Ua: NEGATIVE
Nitrite: NEGATIVE
Protein, ur: NEGATIVE mg/dL
Specific Gravity, Urine: 1.012 (ref 1.005–1.030)
pH: 7 (ref 5.0–8.0)

## 2022-10-30 LAB — CBC WITH DIFFERENTIAL/PLATELET
Abs Immature Granulocytes: 0.02 10*3/uL (ref 0.00–0.07)
Basophils Absolute: 0 10*3/uL (ref 0.0–0.1)
Basophils Relative: 0 %
Eosinophils Absolute: 0 10*3/uL (ref 0.0–0.5)
Eosinophils Relative: 0 %
HCT: 39.7 % (ref 36.0–46.0)
Hemoglobin: 13.4 g/dL (ref 12.0–15.0)
Immature Granulocytes: 0 %
Lymphocytes Relative: 11 %
Lymphs Abs: 1.1 10*3/uL (ref 0.7–4.0)
MCH: 30.5 pg (ref 26.0–34.0)
MCHC: 33.8 g/dL (ref 30.0–36.0)
MCV: 90.4 fL (ref 80.0–100.0)
Monocytes Absolute: 0.5 10*3/uL (ref 0.1–1.0)
Monocytes Relative: 4 %
Neutro Abs: 8.8 10*3/uL — ABNORMAL HIGH (ref 1.7–7.7)
Neutrophils Relative %: 85 %
Platelets: 330 10*3/uL (ref 150–400)
RBC: 4.39 MIL/uL (ref 3.87–5.11)
RDW: 12.7 % (ref 11.5–15.5)
WBC: 10.4 10*3/uL (ref 4.0–10.5)
nRBC: 0 % (ref 0.0–0.2)

## 2022-10-30 LAB — BASIC METABOLIC PANEL
Anion gap: 10 (ref 5–15)
BUN: 18 mg/dL (ref 6–20)
CO2: 24 mmol/L (ref 22–32)
Calcium: 9.6 mg/dL (ref 8.9–10.3)
Chloride: 103 mmol/L (ref 98–111)
Creatinine, Ser: 1.2 mg/dL — ABNORMAL HIGH (ref 0.44–1.00)
GFR, Estimated: 54 mL/min — ABNORMAL LOW (ref >60)
Glucose, Bld: 136 mg/dL — ABNORMAL HIGH (ref 70–99)
Potassium: 3.9 mmol/L (ref 3.5–5.1)
Sodium: 137 mmol/L (ref 135–145)

## 2022-10-30 LAB — PREGNANCY, URINE: Preg Test, Ur: NEGATIVE

## 2022-10-30 MED ORDER — ONDANSETRON HCL 4 MG/2ML IJ SOLN
INTRAMUSCULAR | Status: AC
  Filled 2022-10-30: qty 2

## 2022-10-30 MED ORDER — METOCLOPRAMIDE HCL 5 MG/ML IJ SOLN
10.0000 mg | Freq: Once | INTRAMUSCULAR | Status: AC
  Administered 2022-10-30: 10 mg via INTRAVENOUS
  Filled 2022-10-30: qty 2

## 2022-10-30 MED ORDER — KETOROLAC TROMETHAMINE 15 MG/ML IJ SOLN
15.0000 mg | Freq: Once | INTRAMUSCULAR | Status: AC
  Administered 2022-10-30: 15 mg via INTRAVENOUS
  Filled 2022-10-30: qty 1

## 2022-10-30 MED ORDER — MORPHINE SULFATE (PF) 2 MG/ML IV SOLN
2.0000 mg | Freq: Once | INTRAVENOUS | Status: AC
  Administered 2022-10-30: 2 mg via INTRAVENOUS
  Filled 2022-10-30: qty 1

## 2022-10-30 NOTE — ED Provider Notes (Signed)
[facility] Provider Note   CSN: [id number] Arrival date & time: 10/30/22  1842     History {Add pertinent medical, surgical, social history, OB history to HPI:1} Chief Complaint  Patient presents with   Flank Pain   Vomiting    Morgan James is a 52 y.o. female with a past medical history of chronic pain and recurrent nephrolithiasis presenting today due to right flank pain.  Has been going on for the past couple days.  She reports it feels like previous kidney stones.  No dysuria or hematuria but does say that she feels as though she is not fully voiding her bladder.  Says that it feels like it is "dripping out."  She was unable to tolerate the pain with her at home pain medications so she came to the emergency department.  No fevers or chills.   Flank Pain       Home Medications Prior to Admission medications   Medication Sig Start Date End Date Taking? Authorizing Provider  albuterol (PROVENTIL HFA;VENTOLIN HFA) 108 (90 Base) MCG/ACT inhaler Inhale 1-2 puffs into the lungs every 6 (six) hours as needed for wheezing or shortness of breath. Patient not taking: Reported on 12/08/2015 10/04/15   Noe Gens, PA-C  Aspirin-Salicylamide-Caffeine (BC HEADACHE POWDER PO) Take 1 Package by mouth daily as needed (pain).    [provider]  methocarbamol (ROBAXIN) 500 MG tablet Take 1 tablet (500 mg total) by mouth 2 (two) times daily. 01/08/22   Tonye Pearson, PA-C  naproxen (NAPROSYN) 500 MG tablet Take 1 tablet (500 mg total) by mouth 2 (two) times daily. 01/08/22   Tonye Pearson, PA-C  oxyCODONE (ROXICODONE) 5 MG immediate release tablet Take 1 tablet (5 mg total) by mouth every 4 (four) hours as needed for severe pain. 07/09/18   Maudie Flakes, MD  traMADol (ULTRAM) 50 MG tablet Take 1 tablet (50 mg total) by mouth every 6 (six) hours as needed. 12/08/15   Carmin Muskrat, MD      Allergies    Patient has no known allergies.     Review of Systems   Review of Systems  Genitourinary:  Positive for flank pain.    Physical Exam Updated Vital Signs BP 127/87   Pulse 72   Temp (!) 97.3 F (36.3 C)   Resp 17   Ht 5\' 2"  (1.575 m)   Wt 66.7 kg   SpO2 98%   BMI 26.89 kg/m  Physical Exam Vitals and nursing note reviewed.  Constitutional:      Appearance: Normal appearance. She is ill-appearing.  HENT:     Head: Normocephalic and atraumatic.  Eyes:     General: No scleral icterus.    Conjunctiva/sclera: Conjunctivae normal.  Pulmonary:     Effort: Pulmonary effort is normal. No respiratory distress.  Abdominal:     Tenderness: There is right CVA tenderness.  Skin:    Findings: No rash.  Neurological:     Mental Status: She is alert.  Psychiatric:        Mood and Affect: Mood normal.     ED Results / Procedures / Treatments   Labs (all labs ordered are listed, but only abnormal results are displayed) Labs Reviewed  BASIC METABOLIC PANEL - Abnormal; Notable for the following components:      Result Value   Glucose, Bld 136 (*)    Creatinine, Ser 1.20 (*)    GFR, Estimated 54 (*)  All other components within normal limits  URINALYSIS, ROUTINE W REFLEX MICROSCOPIC - Abnormal; Notable for the following components:   Hgb urine dipstick SMALL (*)    All other components within normal limits  CBC WITH DIFFERENTIAL/PLATELET - Abnormal; Notable for the following components:   Neutro Abs 8.8 (*)    All other components within normal limits  PREGNANCY, URINE    EKG None  Radiology CT Renal Stone Study  Result Date: 10/30/2022 CLINICAL DATA:  Flank pain EXAM: CT ABDOMEN AND PELVIS WITHOUT CONTRAST TECHNIQUE: Multidetector CT imaging of the abdomen and pelvis was performed following the standard protocol without IV contrast. RADIATION DOSE REDUCTION: This exam was performed according to the departmental dose-optimization program which includes automated exposure control, adjustment of the mA and/or  kV according to patient size and/or use of iterative reconstruction technique. COMPARISON:  CT 07/09/2018 FINDINGS: Lower chest: Lung bases are clear. Hepatobiliary: No focal liver abnormality is seen. Status post cholecystectomy. No biliary dilatation. Pancreas: Unremarkable. No pancreatic ductal dilatation or surrounding inflammatory changes. Spleen: Normal in size without focal abnormality. Adrenals/Urinary Tract: Adrenal glands are within normal limits. Multiple intrarenal stones bilaterally, the largest on the left is seen at the lower pole and measures 6 mm. Largest on the right is also seen at the lower pole and measures 6 mm. Mild right hydronephrosis and proximal hydroureter, secondary to a 9 mm stone in the proximal right ureter about 4.5 cm distal to the right UPJ. The bladder is unremarkable Stomach/Bowel: Stomach is within normal limits. Appendix appears normal. No evidence of bowel wall thickening, distention, or inflammatory changes. Diverticular disease of the colon. Vascular/Lymphatic: No significant vascular findings are present. No enlarged abdominal or pelvic lymph nodes. Reproductive: Uterus and bilateral adnexa are unremarkable. Other: Small fat containing umbilical hernia. No free air or free fluid Musculoskeletal: No acute or significant osseous findings. IMPRESSION: 1. Mild right hydronephrosis and proximal hydroureter, secondary to a 9 mm stone in the proximal right ureter about 4.5 cm distal to the right UPJ. 2.   Multiple bilateral intrarenal stones. 3. Diverticular disease of the colon without acute inflammatory process. Electronically Signed   By: Donavan Foil M.D.   On: 10/30/2022 21:21    Procedures Procedures  {Document cardiac monitor, telemetry assessment procedure when appropriate:1}  Medications Ordered in ED Medications  ondansetron (ZOFRAN) 4 MG/2ML injection (  Given 10/30/22 1930)  ketorolac (TORADOL) 15 MG/ML injection 15 mg (15 mg Intravenous Given 10/30/22 2241)   metoCLOPramide (REGLAN) injection 10 mg (10 mg Intravenous Given 10/30/22 2245)  morphine (PF) 2 MG/ML injection 2 mg (2 mg Intravenous Given 10/30/22 2241)    ED Course/ Medical Decision Making/ A&P   {   Click here for ABCD2, HEART and other calculatorsREFRESH Note before signing :1}                          Medical Decision Making Amount and/or Complexity of Data Reviewed Labs: ordered. Radiology: ordered.  Risk Prescription drug management.   This is a *** who presents to the ED for concern of ***.    This is not an exhaustive differential.    Past Medical History / Co-morbidities / Social History: ***   Additional history: ***   Physical Exam: Pertinent physical exam findings include   Lab Tests: I ordered, and personally interpreted labs.  The pertinent results include: Microscopic hematuria, no infection on UA Normal white count, normal kidney function   Imaging Studies:  IMPRESSION:  1. Mild right hydronephrosis and proximal hydroureter, secondary to  a 9 mm stone in the proximal right ureter about 4.5 cm distal to the  right UPJ.    2.   Multiple bilateral intrarenal stones.     Medications: Given Zofran in triage.  Helped her nausea but reports being nauseated right now  Reglan, morphine, Zofran and Toradol. *** Consultations Obtained: I spoke with *** with urology and they ***.  MDM/Disposition: This is a ***  After patient's work-up today, I feel that *** .     I discussed this case with my attending physician Dr. Marland Kitchen who cosigned this note including patient's presenting symptoms, physical exam, and planned diagnostics and interventions. Attending physician stated agreement with plan or made changes to plan which were implemented.     {Document critical care time when appropriate:1} {Document review of labs and clinical decision tools ie heart score, Chads2Vasc2 etc:1}  {Document your independent review of radiology images, and any outside  records:1} {Document your discussion with family members, caretakers, and with consultants:1} {Document social determinants of health affecting pt's care:1} {Document your decision making why or why not admission, treatments were needed:1} Final Clinical Impression(s) / ED Diagnoses Final diagnoses:  None    Rx / DC Orders ED Discharge Orders     None

## 2022-10-30 NOTE — ED Triage Notes (Signed)
POV from home, A&O x 4, GCS 15, BIB wheelchair  Couple days of right sided flank pain that radiates to abd but worsened this am, states that it goes across abd at times, endorses dysuria and vomiting.  Hx of stones, lithotripsy, and stents in the past.

## 2022-10-31 MED ORDER — OXYCODONE HCL 5 MG PO TABS: 5.0000 mg | ORAL_TABLET | Freq: Four times a day (QID) | ORAL | 0 refills | Status: DC | PRN

## 2022-10-31 MED ORDER — IBUPROFEN 600 MG PO TABS: 600.0000 mg | ORAL_TABLET | Freq: Four times a day (QID) | ORAL | 0 refills | Status: DC | PRN

## 2022-10-31 MED ORDER — ACETAMINOPHEN 500 MG PO TABS: 500.0000 mg | ORAL_TABLET | Freq: Four times a day (QID) | ORAL | 0 refills | Status: DC | PRN

## 2022-10-31 MED ORDER — ONDANSETRON 4 MG PO TBDP
4.0000 mg | ORAL_TABLET | Freq: Three times a day (TID) | ORAL | 0 refills | Status: DC | PRN
Start: 2022-10-31 — End: 2022-12-06

## 2022-10-31 MED ORDER — TAMSULOSIN HCL 0.4 MG PO CAPS: 0.4000 mg | ORAL_CAPSULE | Freq: Every day | ORAL | 0 refills | Status: DC

## 2022-10-31 MED ORDER — OXYCODONE-ACETAMINOPHEN 5-325 MG PO TABS
1.0000 | ORAL_TABLET | Freq: Once | ORAL | Status: AC
  Administered 2022-10-31: 1 via ORAL
  Filled 2022-10-31: qty 1

## 2022-10-31 NOTE — Discharge Instructions (Addendum)
You came to the emergency department today with flank pain.  You have a 9 mm stone on the right side.  I have sent Zofran, Flomax and oxycodone to your pharmacy.  As we discussed, oxycodone is a controlled substance so keep out of the reach of others.  Dr. Bess Harvest is the urologist I spoke to about your case today.  You may call for an appointment or call any other urologist of your choosing.  Do not hesitate to return with any worsening symptoms, especially fevers, chills, inability urinate or worsening pain.  It was a pleasure to meet you and we hope you feel better!

## 2022-10-31 NOTE — ED Notes (Signed)
RN reviewed discharge instructions with pt. Pt verbalized understanding and had no further questions. VSS upon discharge.  

## 2022-12-04 ENCOUNTER — Ambulatory Visit: Payer: 59 | Admitting: Urology

## 2022-12-04 ENCOUNTER — Encounter: Payer: Self-pay | Admitting: Urology

## 2022-12-04 ENCOUNTER — Ambulatory Visit (HOSPITAL_BASED_OUTPATIENT_CLINIC_OR_DEPARTMENT_OTHER)
Admission: RE | Admit: 2022-12-04 | Discharge: 2022-12-04 | Disposition: A | Discharge status: Home / Self Care | Payer: 59 | Source: Ambulatory Visit | Attending: Urology | Admitting: Urology

## 2022-12-04 VITALS — BP 116/71 | HR 89 | Ht 63.0 in | Wt 140.0 lb

## 2022-12-04 DIAGNOSIS — N200 Calculus of kidney: Secondary | ICD-10-CM | POA: Insufficient documentation

## 2022-12-04 DIAGNOSIS — Z87442 Personal history of urinary calculi: Secondary | ICD-10-CM | POA: Diagnosis not present

## 2022-12-04 DIAGNOSIS — N201 Calculus of ureter: Secondary | ICD-10-CM

## 2022-12-04 DIAGNOSIS — N202 Calculus of kidney with calculus of ureter: Secondary | ICD-10-CM | POA: Diagnosis not present

## 2022-12-04 LAB — URINALYSIS, ROUTINE W REFLEX MICROSCOPIC
Bilirubin, UA: NEGATIVE
Glucose, UA: NEGATIVE
Ketones, UA: NEGATIVE
Nitrite, UA: NEGATIVE
Specific Gravity, UA: 1.03 (ref 1.005–1.030)
Urobilinogen, Ur: 0.2 mg/dL (ref 0.2–1.0)
pH, UA: 6 (ref 5.0–7.5)

## 2022-12-04 LAB — MICROSCOPIC EXAMINATION
Cast Type: NONE SEEN
Casts: NONE SEEN /lpf
Crystal Type: NONE SEEN
Crystals: NONE SEEN
Trichomonas, UA: NONE SEEN
Yeast, UA: NONE SEEN

## 2022-12-04 NOTE — Progress Notes (Signed)
Assessment: 1. Nephrolithiasis   2. Right ureteral stone      Plan: Today I had a long discussion with the patient regarding management options for ureteral calculus including ESL and endoscopic management.  We discussed the pros and cons of these approaches including expected stone free rates, need for additional procedures, and potential adverse events and complications.  The patient has had bad experience previously with ureteroscopy and tolerating a stent and would very much prefer to try ESL. Nature procedure reviewed in detail today including potential adverse events and complications including the potential for obstructive fragments requiring additional procedures.  Will set up next available.  Will repeat KUB same day as esl.  ADDENDUM:  PATIENT RAN OUT OF PAIN MED AND ZOFRAN.  RX'S RENEWED   Chief Complaint: nephrolithiasis  History of Present Illness:  Morgan James is a 52 y.o. female with a pmhx of  IBS, HTN, migraines, Raynaud's, anxiety, depression and RLS who is seen in consultation from Trisha Mangle, FNP for evaluation of nephrolithiasis.  Patient has a history of nephrolithiasis and presented with right-sided flank pain nausea and vomiting to the emergency department on 10/30/2022.  CT stone study at that time demonstrated multiple bilateral nonobstructing stones as well as a 9 mm right proximal ureteral calculus with moderate right hydro.  The patient was subsequently seen by urology at [facility] on 11/02/2022 with plans for subsequent endoscopic management.  The patient states that she was unable to get scheduled in any reasonable timeframe and presents here now to establish ongoing care.  KUB today shows stone further distal overlying sacrum at level of iliac vessels   Past Medical History:  Past Medical History:  Diagnosis Date   AC (acromioclavicular) joint bone spurs    Fibromyalgia    IBS (irritable bowel syndrome)    Kidney stones    Osteoarthritis     Ovarian cyst    Raynaud's disease    Rheumatoid arthritis (HCC)    Sjogren's disease (HCC)     Past Surgical History:  Past Surgical History:  Procedure Laterality Date   c sections     CHOLECYSTECTOMY     ENDOMETRIAL ABLATION     LITHOTRIPSY      Allergies:  No Known Allergies  Family History:  Family History  Problem Relation Age of Onset   Hypertension Other    Diabetes Other    Cancer Other     Social History:  Social History   Tobacco Use   Smoking status: Former    Packs/day: 0    Types: Cigarettes   Smokeless tobacco: Never  Vaping Use   Vaping Use: Never used  Substance Use Topics   Alcohol use: No   Drug use: No    Review of symptoms:  Constitutional:  Negative for unexplained weight loss, night sweats, fever, chills ENT:  Negative for nose bleeds, sinus pain, painful swallowing CV:  Negative for chest pain, shortness of breath, exercise intolerance, palpitations, loss of consciousness Resp:  Negative for cough, wheezing, shortness of breath GI:  Negative for nausea, vomiting, diarrhea, bloody stools GU:  Positives noted in HPI; otherwise negative for gross hematuria, dysuria, urinary incontinence Neuro:  Negative for seizures, poor balance, limb weakness, slurred speech Psych:  Negative for lack of energy, depression, anxiety Endocrine:  Negative for polydipsia, polyuria, symptoms of hypoglycemia (dizziness, hunger, sweating) Hematologic:  Negative for anemia, purpura, petechia, prolonged or excessive bleeding, use of anticoagulants  Allergic:  Negative for difficulty breathing or  choking as a result of exposure to anything; no shellfish allergy; no allergic response (rash/itch) to materials, foods  Physical exam: BP 116/71   Pulse 89   Ht 5\' 3"  (1.6 m)   Wt 140 lb (63.5 kg)   BMI 24.80 kg/m  GENERAL APPEARANCE:  Well appearing, well developed, well nourished, NAD HEENT: Atraumatic, Normocephalic LUNGS: Clear to auscultation  bilaterally. HEART: Regular Rate  ABDOMEN: Soft, non-tender, No Masses. EXTREMITIES: Moves all extremities well.   NEUROLOGIC:  Alert and oriented x 3, normal gait MENTAL STATUS:  Appropriate. SKIN:  Warm, dry and intact.    Results: KUB 12/04/22--- 1. The 9 mm stone within the right ureter previously present at about the L4-5 disc level has progressed and now overlies the right sacral ala. 2. Multiple small bilateral nonobstructing renal calculi.

## 2022-12-05 ENCOUNTER — Ambulatory Visit: Payer: Self-pay | Admitting: Urology

## 2022-12-05 ENCOUNTER — Encounter: Payer: Self-pay | Admitting: Urology

## 2022-12-05 DIAGNOSIS — N201 Calculus of ureter: Secondary | ICD-10-CM

## 2022-12-05 NOTE — Progress Notes (Signed)
Pre op phone call completed. Arrive at 1300. Bring blue folder. NPO after midnight.

## 2022-12-06 ENCOUNTER — Encounter: Payer: Self-pay | Admitting: Urology

## 2022-12-06 ENCOUNTER — Telehealth: Payer: Self-pay | Admitting: Urology

## 2022-12-06 LAB — URINE CULTURE

## 2022-12-06 MED ORDER — ONDANSETRON 4 MG PO TBDP: 4.0000 mg | ORAL_TABLET | Freq: Three times a day (TID) | ORAL | 0 refills | Status: DC | PRN

## 2022-12-06 MED ORDER — OXYCODONE HCL 5 MG PO TABS: 5.0000 mg | ORAL_TABLET | Freq: Four times a day (QID) | ORAL | 0 refills | Status: DC | PRN

## 2022-12-06 MED ORDER — TAMSULOSIN HCL 0.4 MG PO CAPS: 0.4000 mg | ORAL_CAPSULE | Freq: Every day | ORAL | 0 refills | Status: DC

## 2022-12-06 NOTE — Addendum Note (Signed)
Addended by: [name] on: 12/06/2022 05:15 PM   Modules accepted: Orders

## 2022-12-06 NOTE — Addendum Note (Signed)
Addended by: [name] on: 12/06/2022 04:37 PM   Modules accepted: Orders

## 2022-12-06 NOTE — Telephone Encounter (Signed)
Patient called and left a voicemail in regards to medication questions she had and also mentioned needing a refill on certain medications, only stated FLOMAX and did not include other medications.  Patients callback #: [phone]

## 2022-12-06 NOTE — Telephone Encounter (Signed)
Patient called back and left another voicemail that she has run out of nausea pain and flomax med and needs a refill before her Litho on Monday,

## 2022-12-07 NOTE — Telephone Encounter (Signed)
Meds were sent in yesterday. Replied to patient through mychart.

## 2022-12-10 ENCOUNTER — Encounter (HOSPITAL_BASED_OUTPATIENT_CLINIC_OR_DEPARTMENT_OTHER): Payer: Self-pay | Admitting: Urology

## 2022-12-10 ENCOUNTER — Ambulatory Visit (HOSPITAL_BASED_OUTPATIENT_CLINIC_OR_DEPARTMENT_OTHER)
Admission: RE | Admit: 2022-12-10 | Discharge: 2022-12-10 | Disposition: A | Discharge status: Home / Self Care | Payer: 59 | Attending: Urology | Admitting: Urology

## 2022-12-10 ENCOUNTER — Other Ambulatory Visit: Payer: Self-pay

## 2022-12-10 ENCOUNTER — Ambulatory Visit (HOSPITAL_COMMUNITY): Payer: 59

## 2022-12-10 ENCOUNTER — Encounter (HOSPITAL_BASED_OUTPATIENT_CLINIC_OR_DEPARTMENT_OTHER)
Admission: RE | Disposition: A | Discharge status: Home / Self Care | Payer: Self-pay | Source: Home / Self Care | Attending: Urology

## 2022-12-10 DIAGNOSIS — N202 Calculus of kidney with calculus of ureter: Secondary | ICD-10-CM | POA: Insufficient documentation

## 2022-12-10 DIAGNOSIS — I10 Essential (primary) hypertension: Secondary | ICD-10-CM | POA: Insufficient documentation

## 2022-12-10 DIAGNOSIS — N201 Calculus of ureter: Secondary | ICD-10-CM

## 2022-12-10 HISTORY — PX: EXTRACORPOREAL SHOCK WAVE LITHOTRIPSY: SHX1557

## 2022-12-10 SURGERY — LITHOTRIPSY, ESWL
Anesthesia: LOCAL | Laterality: Right

## 2022-12-10 MED ORDER — DIPHENHYDRAMINE HCL 25 MG PO CAPS
25.0000 mg | ORAL_CAPSULE | ORAL | Status: AC
  Administered 2022-12-10: 25 mg via ORAL

## 2022-12-10 MED ORDER — KETOROLAC TROMETHAMINE 30 MG/ML IJ SOLN
30.0000 mg | Freq: Once | INTRAMUSCULAR | Status: AC
  Administered 2022-12-10: 30 mg via INTRAVENOUS

## 2022-12-10 MED ORDER — LEVOFLOXACIN 500 MG PO TABS
500.0000 mg | ORAL_TABLET | ORAL | Status: AC
  Administered 2022-12-10: 500 mg via ORAL
  Filled 2022-12-10: qty 1

## 2022-12-10 MED ORDER — KETOROLAC TROMETHAMINE 30 MG/ML IJ SOLN
INTRAMUSCULAR | Status: AC
  Filled 2022-12-10: qty 1

## 2022-12-10 MED ORDER — DIAZEPAM 5 MG PO TABS
ORAL_TABLET | ORAL | Status: AC
  Filled 2022-12-10: qty 2

## 2022-12-10 MED ORDER — SODIUM CHLORIDE 0.9 % IV SOLN: INTRAVENOUS | Status: DC

## 2022-12-10 MED ORDER — DIPHENHYDRAMINE HCL 25 MG PO CAPS
ORAL_CAPSULE | ORAL | Status: AC
  Filled 2022-12-10: qty 1

## 2022-12-10 MED ORDER — DIAZEPAM 5 MG PO TABS
10.0000 mg | ORAL_TABLET | ORAL | Status: AC
  Administered 2022-12-10: 10 mg via ORAL

## 2022-12-10 NOTE — Discharge Instructions (Addendum)
Toradol given IV at 3:48 pm today.   See [facility] post-op instruction form.  Per Dr. Margo Aye, the office will reach out tomorrow 12/11/22 to check on you. No changes to home medications at this time.   Post Anesthesia Home Care Instructions  Activity: Get plenty of rest for the remainder of the day. A responsible individual must stay with you for 24 hours following the procedure.  For the next 24 hours, DO NOT: -Drive a car -Advertising copywriter -Drink alcoholic beverages -Take any medication unless instructed by your physician -Make any legal decisions or sign important papers.  Meals: Start with liquid foods such as gelatin or soup. Progress to regular foods as tolerated. Avoid greasy, spicy, heavy foods. If nausea and/or vomiting occur, drink only clear liquids until the nausea and/or vomiting subsides. Call your physician if vomiting continues.  Special Instructions/Symptoms: Your throat may feel dry or sore from the anesthesia or the breathing tube placed in your throat during surgery. If this causes discomfort, gargle with warm salt water. The discomfort should disappear within 24 hours.

## 2022-12-10 NOTE — H&P (Signed)
Assessment: 1. Nephrolithiasis   2. Right ureteral stone         Plan: Today I had a long discussion with the patient regarding management options for ureteral calculus including ESL and endoscopic management.  We discussed the pros and cons of these approaches including expected stone free rates, need for additional procedures, and potential adverse events and complications.  The patient has had bad experience previously with ureteroscopy and tolerating a stent and would very much prefer to try ESL. Nature procedure reviewed in detail today including potential adverse events and complications including the potential for obstructive fragments requiring additional procedures.  Will set up next available.  Will repeat KUB same day as esl.   ADDENDUM:  PATIENT RAN OUT OF PAIN MED AND ZOFRAN.  RX'S RENEWED     Chief Complaint: nephrolithiasis   History of Present Illness:   Morgan James is a 52 y.o. female with a pmhx of  IBS, HTN, migraines, Raynaud's, anxiety, depression and RLS who is seen in consultation from Trisha Mangle, FNP for evaluation of nephrolithiasis.  Patient has a history of nephrolithiasis and presented with right-sided flank pain nausea and vomiting to the emergency department on 10/30/2022.  CT stone study at that time demonstrated multiple bilateral nonobstructing stones as well as a 9 mm right proximal ureteral calculus with moderate right hydro.  The patient was subsequently seen by urology at [facility] on 11/02/2022 with plans for subsequent endoscopic management.  The patient states that she was unable to get scheduled in any reasonable timeframe and presents here now to establish ongoing care.   KUB today shows stone further distal overlying sacrum at level of iliac vessels     Past Medical History:      Past Medical History:  Diagnosis Date   AC (acromioclavicular) joint bone spurs     Fibromyalgia     IBS (irritable bowel syndrome)     Kidney stones      Osteoarthritis     Ovarian cyst     Raynaud's disease     Rheumatoid arthritis (HCC)     Sjogren's disease (HCC)        Past Surgical History:       Past Surgical History:  Procedure Laterality Date   c sections       CHOLECYSTECTOMY       ENDOMETRIAL ABLATION       LITHOTRIPSY          Allergies:  No Known Allergies   Family History:       Family History  Problem Relation Age of Onset   Hypertension Other     Diabetes Other     Cancer Other        Social History:  Social History         Tobacco Use   Smoking status: Former      Packs/day: 0      Types: Cigarettes   Smokeless tobacco: Never  Vaping Use   Vaping Use: Never used  Substance Use Topics   Alcohol use: No   Drug use: No      Review of symptoms:  Constitutional:  Negative for unexplained weight loss, night sweats, fever, chills ENT:  Negative for nose bleeds, sinus pain, painful swallowing CV:  Negative for chest pain, shortness of breath, exercise intolerance, palpitations, loss of consciousness Resp:  Negative for cough, wheezing, shortness of breath GI:  Negative for nausea, vomiting, diarrhea, bloody stools GU:  Positives noted in HPI; otherwise negative for gross hematuria, dysuria, urinary incontinence Neuro:  Negative for seizures, poor balance, limb weakness, slurred speech Psych:  Negative for lack of energy, depression, anxiety Endocrine:  Negative for polydipsia, polyuria, symptoms of hypoglycemia (dizziness, hunger, sweating) Hematologic:  Negative for anemia, purpura, petechia, prolonged or excessive bleeding, use of anticoagulants  Allergic:  Negative for difficulty breathing or choking as a result of exposure to anything; no shellfish allergy; no allergic response (rash/itch) to materials, foods   Physical exam: BP 116/71   Pulse 89   Ht 5\' 3"  (1.6 m)   Wt 140 lb (63.5 kg)   BMI 24.80 kg/m  GENERAL APPEARANCE:  Well appearing, well developed, well nourished, NAD HEENT:  Atraumatic, Normocephalic LUNGS: Clear to auscultation bilaterally. HEART: Regular Rate  ABDOMEN: Soft, non-tender, No Masses. EXTREMITIES: Moves all extremities well.   NEUROLOGIC:  Alert and oriented x 3, normal gait MENTAL STATUS:  Appropriate. SKIN:  Warm, dry and intact.     Results: KUB 12/04/22--- 1. The 9 mm stone within the right ureter previously present at about the L4-5 disc level has progressed and now overlies the right sacral ala. 2. Multiple small bilateral nonobstructing renal calculi.

## 2022-12-11 ENCOUNTER — Telehealth: Payer: Self-pay

## 2022-12-11 NOTE — Telephone Encounter (Signed)
Spoke with patient, she reports passing a lot of gravel which she is saving to have analyzed. She states that she has #4 of the pain meds left and is trying to use them sparingly. Advised her to let us know if she runs out before Friday as Dr. Margo Aye is out of the office.

## 2022-12-12 ENCOUNTER — Encounter (HOSPITAL_BASED_OUTPATIENT_CLINIC_OR_DEPARTMENT_OTHER): Payer: Self-pay | Admitting: Urology

## 2022-12-12 ENCOUNTER — Other Ambulatory Visit: Payer: Self-pay | Admitting: Urology

## 2022-12-13 ENCOUNTER — Telehealth: Payer: Self-pay | Admitting: Urology

## 2022-12-13 ENCOUNTER — Other Ambulatory Visit: Payer: Self-pay | Admitting: Urology

## 2022-12-13 NOTE — Telephone Encounter (Signed)
Patient called and stated she needs a refill on her pain medication but did not state the name of it. Pharmacy listed below and patient said to make sure it is sent to the pharmacy in [place] and not the pharmacy in [place].   [id number] - [place], [place] - [address] [address] Phone: [phone]  Fax: [phone]      Patients callback #: [phone]

## 2022-12-14 ENCOUNTER — Other Ambulatory Visit: Payer: Self-pay | Admitting: Urology

## 2022-12-14 DIAGNOSIS — N201 Calculus of ureter: Secondary | ICD-10-CM

## 2022-12-14 MED ORDER — OXYCODONE HCL 5 MG PO TABS
5.0000 mg | ORAL_TABLET | Freq: Four times a day (QID) | ORAL | 0 refills | Status: DC | PRN
Start: 2022-12-14 — End: 2023-09-23

## 2022-12-14 NOTE — Telephone Encounter (Signed)
Patient notified

## 2022-12-14 NOTE — Telephone Encounter (Signed)
Patient called back and stated if she is not going to get a refill on her medication, would it be okay to start taking her GOODIES? As she says Tylenol isn't doing anything for her pain. Patient is aware Dr Margo Aye and Christal are out of the office, routing to [place] and Dr Pete Glatter, as patient would like a call back ASAP regarding this.   Patients callback #: [phone]

## 2022-12-19 ENCOUNTER — Ambulatory Visit: Payer: 59 | Admitting: Urology

## 2022-12-19 ENCOUNTER — Encounter: Payer: Self-pay | Admitting: Urology

## 2022-12-19 ENCOUNTER — Ambulatory Visit (HOSPITAL_BASED_OUTPATIENT_CLINIC_OR_DEPARTMENT_OTHER)
Admission: RE | Admit: 2022-12-19 | Discharge: 2022-12-19 | Disposition: A | Discharge status: Home / Self Care | Payer: 59 | Source: Ambulatory Visit | Attending: Urology | Admitting: Urology

## 2022-12-19 VITALS — BP 111/74 | HR 83 | Ht 63.0 in | Wt 141.0 lb

## 2022-12-19 DIAGNOSIS — Z87442 Personal history of urinary calculi: Secondary | ICD-10-CM | POA: Diagnosis not present

## 2022-12-19 DIAGNOSIS — Z09 Encounter for follow-up examination after completed treatment for conditions other than malignant neoplasm: Secondary | ICD-10-CM

## 2022-12-19 DIAGNOSIS — R109 Unspecified abdominal pain: Secondary | ICD-10-CM | POA: Diagnosis not present

## 2022-12-19 DIAGNOSIS — N200 Calculus of kidney: Secondary | ICD-10-CM | POA: Insufficient documentation

## 2022-12-19 LAB — MICROSCOPIC EXAMINATION
Trichomonas, UA: NONE SEEN
Yeast, UA: NONE SEEN

## 2022-12-19 LAB — URINALYSIS, ROUTINE W REFLEX MICROSCOPIC
Bilirubin, UA: NEGATIVE
Glucose, UA: NEGATIVE
Nitrite, UA: NEGATIVE
Specific Gravity, UA: 1.02 (ref 1.005–1.030)
Urobilinogen, Ur: 0.2 mg/dL (ref 0.2–1.0)
pH, UA: 6 (ref 5.0–7.5)

## 2022-12-19 NOTE — Progress Notes (Addendum)
   Assessment: Nephrolithiasis  Plan: Urine for culture today.  Patient is asymptomatic.  Will hold treatment pending culture results. Stone material for analysis Today I had a long discussion with the patient regarding stone prevention measures.  Educational material was provided and we reviewed proper fluid intake, sodium restriction, moderation of animal protein and oxalate. Will proceed with further metabolic evaluation with 24-hour urine studies. Patient will return in approximately 2 to 3 months to review results and make further recommendations at that time.   ADDENDUM 01/16/23-- I called the patient today and went over the results of her 24-hour Litholink study for stone risk factors.  All and all look fairly good but she did have borderline hypocitraturia.  Given this I have recommended potassium citrate ER 15 mEq twice daily-prescription sent. Will obtain BMP in 10 to 14 days Patient will follow-up in 3 months with repeat Litholink prior to visit and KUB on follow-up  Chief Complaint: Kidney stones  HPI: Morgan James is a 52 y.o. female who presents for continued evaluation of nephrolithiasis. Patient is status post right ESL 12/10/2022.  Patient is doing very well.  She denies any further flank pain.  She has passed a lot of gravel and bring some of that material with her today which will be sent for stone analysis.  Patient has a history of nephrolithiasis and presented with right-sided flank pain nausea and vomiting to the emergency department on 10/30/2022. CT stone study at that time demonstrated multiple bilateral nonobstructing stones as well as a 9 mm right proximal ureteral calculus with moderate right hydro.   Portions of the above documentation were copied from a prior visit for review purposes only.  Allergies: No Known Allergies  PMH: Past Medical History:  Diagnosis Date   AC (acromioclavicular) joint bone spurs    Fibromyalgia    IBS (irritable bowel syndrome)     Kidney stones    Osteoarthritis    Ovarian cyst    Raynaud's disease    Rheumatoid arthritis (HCC)    Sjogren's disease (HCC)     PSH: Past Surgical History:  Procedure Laterality Date   c sections     CHOLECYSTECTOMY     ENDOMETRIAL ABLATION     EXTRACORPOREAL SHOCK WAVE LITHOTRIPSY Right 12/10/2022   Procedure: EXTRACORPOREAL SHOCK WAVE LITHOTRIPSY (ESWL);  Surgeon: [name], MD;  Location: [facility];  Service: Urology;  Laterality: Right;   LITHOTRIPSY      SH: Social History   Tobacco Use   Smoking status: Former    Packs/day: 0    Types: Cigarettes    Quit date: 2016    Years since quitting: 8.3   Smokeless tobacco: Never  Vaping Use   Vaping Use: Every day   Substances: Nicotine  Substance Use Topics   Alcohol use: No   Drug use: No    ROS: Constitutional:  Negative for fever, chills, weight loss CV: Negative for chest pain, previous MI, hypertension Respiratory:  Negative for shortness of breath, wheezing, sleep apnea, frequent cough GI:  Negative for nausea, vomiting, bloody stool, GERD  PE: BP 111/74   Pulse 83   Ht 5\' 3"  (1.6 m)   Wt 141 lb (64 kg)   BMI 24.98 kg/m  GENERAL APPEARANCE:  Well appearing, well developed, well nourished, NAD    Results: KUB personally interpreted by me today shows no evidence of residual right distal ureteral stone.

## 2022-12-23 LAB — URINE CULTURE

## 2022-12-25 ENCOUNTER — Telehealth: Payer: Self-pay

## 2022-12-25 ENCOUNTER — Other Ambulatory Visit: Payer: Self-pay | Admitting: Urology

## 2022-12-25 ENCOUNTER — Encounter: Payer: Self-pay | Admitting: Urology

## 2022-12-25 MED ORDER — NITROFURANTOIN MONOHYD MACRO 100 MG PO CAPS: 100.0000 mg | ORAL_CAPSULE | Freq: Two times a day (BID) | ORAL | 0 refills | Status: DC

## 2022-12-25 NOTE — Telephone Encounter (Signed)
-----   Message from [name], MD sent at 12/25/2022  8:43 AM EDT ----- Regarding: UTI Please call her and let her know that her urine culture did grow staph.  I have sent in a prescription for Macrobid to take for 5 days to [facility].  Let her know.  Thank you

## 2022-12-25 NOTE — Telephone Encounter (Signed)
Spoke with pt re: results and recommendations. Pt verbalized understanding.

## 2022-12-25 NOTE — Addendum Note (Signed)
Addended by: Carolin Coy on: 12/25/2022 10:17 AM   Modules accepted: Orders

## 2022-12-25 NOTE — Telephone Encounter (Signed)
Attempted to call patient on cell; no answer.

## 2023-01-01 DIAGNOSIS — R051 Acute cough: Secondary | ICD-10-CM | POA: Diagnosis not present

## 2023-01-01 DIAGNOSIS — H9201 Otalgia, right ear: Secondary | ICD-10-CM | POA: Diagnosis not present

## 2023-01-01 DIAGNOSIS — J018 Other acute sinusitis: Secondary | ICD-10-CM | POA: Diagnosis not present

## 2023-01-01 DIAGNOSIS — Z6825 Body mass index (BMI) 25.0-25.9, adult: Secondary | ICD-10-CM | POA: Diagnosis not present

## 2023-01-02 DIAGNOSIS — G4700 Insomnia, unspecified: Secondary | ICD-10-CM | POA: Diagnosis not present

## 2023-01-02 DIAGNOSIS — F419 Anxiety disorder, unspecified: Secondary | ICD-10-CM | POA: Diagnosis not present

## 2023-01-02 DIAGNOSIS — E559 Vitamin D deficiency, unspecified: Secondary | ICD-10-CM | POA: Diagnosis not present

## 2023-01-02 DIAGNOSIS — G2581 Restless legs syndrome: Secondary | ICD-10-CM | POA: Diagnosis not present

## 2023-01-02 DIAGNOSIS — B379 Candidiasis, unspecified: Secondary | ICD-10-CM | POA: Diagnosis not present

## 2023-01-02 DIAGNOSIS — F332 Major depressive disorder, recurrent severe without psychotic features: Secondary | ICD-10-CM | POA: Diagnosis not present

## 2023-01-08 ENCOUNTER — Other Ambulatory Visit: Payer: Self-pay | Admitting: Urology

## 2023-01-08 DIAGNOSIS — N200 Calculus of kidney: Secondary | ICD-10-CM | POA: Diagnosis not present

## 2023-01-12 LAB — LITHOLINK 24HR URINE PANEL
Ammonium, Urine: 8 mmol/24 hr — ABNORMAL LOW (ref 15–60)
Calcium Oxalate Saturation: 3.85 — ABNORMAL LOW (ref 6.00–10.00)
Calcium Phosphate Saturation: 1.26 (ref 0.50–2.00)
Calcium, Urine: 139 mg/24 hr (ref <200)
Calcium/Creatinine Ratio: 146 mg/g creat (ref 51–262)
Calcium/Kg Body Weight: 2.2 mg/24 hr/kg (ref <4.0)
Chloride, Urine: 123 mmol/24 hr (ref 70–250)
Citrate, Urine: 508 mg/24 hr — ABNORMAL LOW (ref >550)
Creatinine, Urine: 957 mg/24 hr
Creatinine/Kg Body Weight: 15.1 mg/24 hr/kg (ref 8.7–20.3)
Cystine, Urine, Qualitative: NEGATIVE
Magnesium, Urine: 61 mg/24 hr (ref 30–120)
Oxalate, Urine: 31 mg/24 hr (ref 20–40)
Phosphorus, Urine: 571 mg/24 hr — ABNORMAL LOW (ref 600–1200)
Potassium, Urine: 47 mmol/24 hr (ref 20–100)
Protein Catabolic Rate: 0.7 g/kg/24 hr — ABNORMAL LOW (ref 0.8–1.4)
Sodium, Urine: 152 mmol/24 hr — ABNORMAL HIGH (ref 50–150)
Sulfate, Urine: <7 meq/24 hr — ABNORMAL LOW (ref 20–80)
Urea Nitrogen, Urine: 4.96 g/24 hr — ABNORMAL LOW (ref 6.00–14.00)
Uric Acid Saturation: 0.04 (ref <1.00)
Uric Acid, Urine: 565 mg/24 hr (ref <750)
Urine Volume (Preserved): 2430 mL/24 hr (ref 500–4000)
pH, 24 hr, Urine: 7.206 — ABNORMAL HIGH (ref 5.800–6.200)

## 2023-01-16 ENCOUNTER — Encounter: Payer: Self-pay | Admitting: Urology

## 2023-01-16 MED ORDER — POTASSIUM CITRATE ER 15 MEQ (1620 MG) PO TBCR
1.0000 | EXTENDED_RELEASE_TABLET | Freq: Two times a day (BID) | ORAL | 3 refills | Status: DC
Start: 2023-01-16 — End: 2023-09-25

## 2023-01-16 NOTE — Addendum Note (Signed)
Addended by: Marcha Solders C on: 01/16/2023 05:00 PM   Modules accepted: Orders

## 2023-01-18 ENCOUNTER — Telehealth: Payer: Self-pay

## 2023-01-18 NOTE — Telephone Encounter (Signed)
-----   Message from [name], MD sent at 01/16/2023  4:53 PM EDT ----- Regarding: few things I called her and went over litholink results. Please cancel her appt for July 24- not necessary.  I prescribed her Rx for k-citrate but forgot to tell her she needs a bmp to check her renal function and K level the first week of July.  Order placed.  I need to see back in 3 mo and she needs to do another litholink in 6 weeks.  Order on your desk. Please schedule that appt and she needs KUB when she gets here- order placed. thanks

## 2023-01-18 NOTE — Telephone Encounter (Signed)
Left msg for return call from patient to get scheduled for a lab appt and follow up in 3 months.

## 2023-01-20 DIAGNOSIS — H10021 Other mucopurulent conjunctivitis, right eye: Secondary | ICD-10-CM | POA: Diagnosis not present

## 2023-01-20 DIAGNOSIS — Z6825 Body mass index (BMI) 25.0-25.9, adult: Secondary | ICD-10-CM | POA: Diagnosis not present

## 2023-01-21 ENCOUNTER — Other Ambulatory Visit: Payer: Self-pay | Admitting: Family Medicine

## 2023-01-21 DIAGNOSIS — Z1231 Encounter for screening mammogram for malignant neoplasm of breast: Secondary | ICD-10-CM

## 2023-01-23 ENCOUNTER — Ambulatory Visit
Admission: RE | Admit: 2023-01-23 | Discharge: 2023-01-23 | Disposition: A | Discharge status: Home / Self Care | Payer: 59 | Source: Ambulatory Visit

## 2023-01-23 DIAGNOSIS — Z1231 Encounter for screening mammogram for malignant neoplasm of breast: Secondary | ICD-10-CM

## 2023-01-29 ENCOUNTER — Ambulatory Visit: Payer: 59 | Admitting: Podiatry

## 2023-02-20 ENCOUNTER — Ambulatory Visit: Payer: 59 | Admitting: Urology

## 2023-09-10 ENCOUNTER — Telehealth: Payer: Self-pay | Admitting: Urology

## 2023-09-10 NOTE — Telephone Encounter (Signed)
Pt called and  is thinking she has a kidney stone and wants to see if you want her to have a KUB before her apt tommorow? Please advise.

## 2023-09-11 ENCOUNTER — Ambulatory Visit: Payer: Self-pay | Admitting: Urology

## 2023-09-23 ENCOUNTER — Emergency Department (HOSPITAL_BASED_OUTPATIENT_CLINIC_OR_DEPARTMENT_OTHER)
Admission: EM | Admit: 2023-09-23 | Discharge: 2023-09-23 | Disposition: A | Discharge status: Home / Self Care | Payer: BC Managed Care – PPO | Attending: Emergency Medicine | Admitting: Emergency Medicine

## 2023-09-23 ENCOUNTER — Other Ambulatory Visit: Payer: Self-pay

## 2023-09-23 ENCOUNTER — Emergency Department (HOSPITAL_BASED_OUTPATIENT_CLINIC_OR_DEPARTMENT_OTHER): Payer: BC Managed Care – PPO

## 2023-09-23 DIAGNOSIS — N201 Calculus of ureter: Secondary | ICD-10-CM | POA: Insufficient documentation

## 2023-09-23 DIAGNOSIS — R1032 Left lower quadrant pain: Secondary | ICD-10-CM | POA: Diagnosis present

## 2023-09-23 DIAGNOSIS — R109 Unspecified abdominal pain: Secondary | ICD-10-CM

## 2023-09-23 LAB — BASIC METABOLIC PANEL
Anion gap: 10 (ref 5–15)
BUN: 10 mg/dL (ref 6–20)
CO2: 23 mmol/L (ref 22–32)
Calcium: 9.4 mg/dL (ref 8.9–10.3)
Chloride: 105 mmol/L (ref 98–111)
Creatinine, Ser: 0.75 mg/dL (ref 0.44–1.00)
GFR, Estimated: >60 mL/min (ref >60)
Glucose, Bld: 98 mg/dL (ref 70–99)
Potassium: 3.9 mmol/L (ref 3.5–5.1)
Sodium: 138 mmol/L (ref 135–145)

## 2023-09-23 LAB — CBC
HCT: 38.2 % (ref 36.0–46.0)
Hemoglobin: 12.7 g/dL (ref 12.0–15.0)
MCH: 31.5 pg (ref 26.0–34.0)
MCHC: 33.2 g/dL (ref 30.0–36.0)
MCV: 94.8 fL (ref 80.0–100.0)
Platelets: 285 10*3/uL (ref 150–400)
RBC: 4.03 MIL/uL (ref 3.87–5.11)
RDW: 12.2 % (ref 11.5–15.5)
WBC: 4.5 10*3/uL (ref 4.0–10.5)
nRBC: 0 % (ref 0.0–0.2)

## 2023-09-23 LAB — URINALYSIS, ROUTINE W REFLEX MICROSCOPIC
Bilirubin Urine: NEGATIVE
Glucose, UA: NEGATIVE mg/dL
Ketones, ur: NEGATIVE mg/dL
Nitrite: NEGATIVE
Protein, ur: NEGATIVE mg/dL
Specific Gravity, Urine: 1.01 (ref 1.005–1.030)
pH: 7 (ref 5.0–8.0)

## 2023-09-23 LAB — URINALYSIS, MICROSCOPIC (REFLEX)

## 2023-09-23 MED ORDER — KETOROLAC TROMETHAMINE 10 MG PO TABS: 10.0000 mg | ORAL_TABLET | Freq: Four times a day (QID) | ORAL | 0 refills | Status: DC | PRN

## 2023-09-23 MED ORDER — ONDANSETRON 4 MG PO TBDP
4.0000 mg | ORAL_TABLET | Freq: Three times a day (TID) | ORAL | 0 refills | Status: DC | PRN
Start: 2023-09-23 — End: 2024-05-11

## 2023-09-23 MED ORDER — PROMETHAZINE HCL 25 MG RE SUPP: 25.0000 mg | Freq: Four times a day (QID) | RECTAL | 0 refills | Status: DC | PRN

## 2023-09-23 MED ORDER — KETOROLAC TROMETHAMINE 15 MG/ML IJ SOLN
15.0000 mg | Freq: Once | INTRAMUSCULAR | Status: AC
  Administered 2023-09-23: 15 mg via INTRAVENOUS
  Filled 2023-09-23: qty 1

## 2023-09-23 MED ORDER — CEFADROXIL 500 MG PO CAPS: 500.0000 mg | ORAL_CAPSULE | Freq: Two times a day (BID) | ORAL | 0 refills | Status: DC

## 2023-09-23 MED ORDER — SODIUM CHLORIDE 0.9 % IV BOLUS
500.0000 mL | Freq: Once | INTRAVENOUS | Status: AC
  Administered 2023-09-23: 500 mL via INTRAVENOUS

## 2023-09-23 MED ORDER — ONDANSETRON HCL 4 MG/2ML IJ SOLN
4.0000 mg | Freq: Once | INTRAMUSCULAR | Status: AC
  Administered 2023-09-23: 4 mg via INTRAVENOUS
  Filled 2023-09-23: qty 2

## 2023-09-23 MED ORDER — TAMSULOSIN HCL 0.4 MG PO CAPS: 0.4000 mg | ORAL_CAPSULE | Freq: Every day | ORAL | 0 refills | Status: DC

## 2023-09-23 MED ORDER — OXYCODONE HCL 5 MG PO TABS: 2.5000 mg | ORAL_TABLET | Freq: Four times a day (QID) | ORAL | 0 refills | Status: DC | PRN

## 2023-09-23 MED ORDER — MORPHINE SULFATE (PF) 4 MG/ML IV SOLN
4.0000 mg | Freq: Once | INTRAVENOUS | Status: AC
  Administered 2023-09-23: 4 mg via INTRAVENOUS
  Filled 2023-09-23: qty 1

## 2023-09-23 NOTE — ED Provider Notes (Signed)
  EMERGENCY DEPARTMENT AT [facility] Provider Note   CSN: [id number] Arrival date & time: 09/23/23  1152     History  Chief Complaint  Patient presents with   Abdominal Pain    Morgan James is a 53 y.o. female.   Abdominal Pain   53 year old female presents emergency department with complaints of left-sided flank pain.  Reports pain felt moderately in her left low back yesterday which acutely worsened earlier this morning.  Patient with history of kidney stones and states this feels similar.  Reports left low back pain with radiation to left inguinal region.  States that symptoms currently feel exactly like prior kidney stones in the past.  Did have to have lithotripsy x 2 for large kidney stones most recently in mid 2024 by Dr. Margo Aye.  Denies any fever, chills, urinary frequency/urgency, noticeable hematuria.  Denies any vomiting but has felt nauseous.  Past medical history significant for Raynaud's arthritis, Sjogren's disease, kidney stones, OA, IBS, fibromyalgia  Home Medications Prior to Admission medications   Medication Sig Start Date End Date Taking? Authorizing Provider  cefadroxil (DURICEF) 500 MG capsule Take 1 capsule (500 mg total) by mouth 2 (two) times daily. 09/23/23  Yes Sherian Maroon A, PA  ketorolac (TORADOL) 10 MG tablet Take 1 tablet (10 mg total) by mouth every 6 (six) hours as needed. 09/23/23  Yes Sherian Maroon A, PA  ondansetron (ZOFRAN-ODT) 4 MG disintegrating tablet Take 1 tablet (4 mg total) by mouth every 8 (eight) hours as needed. 09/23/23  Yes Sherian Maroon A, PA  oxyCODONE (ROXICODONE) 5 MG immediate release tablet Take 0.5 tablets (2.5 mg total) by mouth every 6 (six) hours as needed for severe pain (pain score 7-10). 09/23/23  Yes Peter Garter, PA  promethazine (PHENERGAN) 25 MG suppository Place 1 suppository (25 mg total) rectally every 6 (six) hours as needed for nausea or vomiting. 09/23/23  Yes Sherian Maroon A, PA   tamsulosin (FLOMAX) 0.4 MG CAPS capsule Take 1 capsule (0.4 mg total) by mouth daily. 09/23/23  Yes Sherian Maroon A, PA  acetaminophen (TYLENOL) 500 MG tablet Take 1 tablet (500 mg total) by mouth every 6 (six) hours as needed. 10/31/22   Redwine, Madison A, PA-C  Aspirin-Salicylamide-Caffeine (BC HEADACHE POWDER PO) Take 1 Package by mouth daily as needed (pain).    [provider]  buPROPion (WELLBUTRIN XL) 150 MG 24 hr tablet Take 150 mg by mouth daily.    [provider]  DULoxetine (CYMBALTA) 30 MG capsule Take 3 capsules by mouth daily. 06/19/22   [provider]  nitrofurantoin, macrocrystal-monohydrate, (MACROBID) 100 MG capsule Take 1 capsule (100 mg total) by mouth every 12 (twelve) hours. 12/25/22   [name], MD  Potassium Citrate 15 MEQ (1620 MG) TBCR Take 1 tablet by mouth 2 (two) times daily after a meal. 01/16/23   [name], MD      Allergies    Patient has no known allergies.    Review of Systems   Review of Systems  Gastrointestinal:  Positive for abdominal pain.  All other systems reviewed and are negative.   Physical Exam Updated Vital Signs BP 127/81   Pulse 83   Temp 98.5 F (36.9 C)   Resp 19   Ht 5\' 2"  (1.575 m)   Wt 61.2 kg   SpO2 97%   BMI 24.69 kg/m  Physical Exam Vitals and nursing note reviewed.  Constitutional:      General:  She is not in acute distress.    Appearance: She is well-developed.  HENT:     Head: Normocephalic and atraumatic.  Eyes:     Conjunctiva/sclera: Conjunctivae normal.  Cardiovascular:     Rate and Rhythm: Normal rate and regular rhythm.     Heart sounds: No murmur heard. Pulmonary:     Effort: Pulmonary effort is normal. No respiratory distress.     Breath sounds: Normal breath sounds.  Abdominal:     Palpations: Abdomen is soft.     Tenderness: There is abdominal tenderness in the left lower quadrant. There is left CVA tenderness. There is no guarding. Negative signs include  Murphy's sign.  Musculoskeletal:        General: No swelling.     Cervical back: Neck supple.  Skin:    General: Skin is warm and dry.     Capillary Refill: Capillary refill takes less than 2 seconds.  Neurological:     Mental Status: She is alert.  Psychiatric:        Mood and Affect: Mood normal.     ED Results / Procedures / Treatments   Labs (all labs ordered are listed, but only abnormal results are displayed) Labs Reviewed  URINALYSIS, ROUTINE W REFLEX MICROSCOPIC - Abnormal; Notable for the following components:      Result Value   APPearance HAZY (*)    Hgb urine dipstick MODERATE (*)    Leukocytes,Ua SMALL (*)    All other components within normal limits  URINALYSIS, MICROSCOPIC (REFLEX) - Abnormal; Notable for the following components:   Bacteria, UA RARE (*)    All other components within normal limits  URINE CULTURE  BASIC METABOLIC PANEL  CBC    EKG None  Radiology CT Renal Stone Study Result Date: 09/23/2023 CLINICAL DATA:  Left lower quadrant pain radiating to the back. Hematuria. History of renal calculi. EXAM: CT ABDOMEN AND PELVIS WITHOUT CONTRAST TECHNIQUE: Multidetector CT imaging of the abdomen and pelvis was performed following the standard protocol without IV contrast. RADIATION DOSE REDUCTION: This exam was performed according to the departmental dose-optimization program which includes automated exposure control, adjustment of the mA and/or kV according to patient size and/or use of iterative reconstruction technique. COMPARISON:  10/30/2022 FINDINGS: Lower chest: Unremarkable Hepatobiliary: Cholecystectomy.  Otherwise unremarkable. Pancreas: Unremarkable Spleen: Unremarkable Adrenals/Urinary Tract: Both adrenal glands appear normal. No left hydronephrosis but there is some mild left hydroureter just distal to the iliac vessel cross over. This immediately precedes a mid to distal cluster of ureteral calculi, with the proximal calculus 0.6 cm in long axis  and the distal calculus 0.6 cm in long axis, shown on image 54 series 301. The left ureter distal to this level is of normal caliber in the urinary bladder appears normal. Three nonobstructive right renal calculi are observed, the largest in the lower pole measuring 0.5 cm in long axis. Seven nonobstructive left renal calculi are present, the largest measuring 0.7 cm in the lower pole. Stomach/Bowel: Widespread scattered colonic diverticula most concentrated in the sigmoid colon and ascending colon. Vascular/Lymphatic: Unremarkable Reproductive: Unremarkable Other: No supplemental non-categorized findings. Musculoskeletal: Bilateral degenerative facet arthropathy at L5-S1. IMPRESSION: 1. Mild left hydroureter immediately precedes a mid to distal cluster of ureteral calculi, with the proximal calculus 0.6 cm in long axis and the distal calculus 0.6 cm in long axis. 2. Additional nonobstructive bilateral renal calculi. 3. Widespread colonic diverticulosis. 4. Bilateral degenerative facet arthropathy at L5-S1. Electronically Signed   By: Gaylyn Rong  M.D.   On: 09/23/2023 14:45    Procedures Procedures    Medications Ordered in ED Medications  ketorolac (TORADOL) 15 MG/ML injection 15 mg (15 mg Intravenous Given 09/23/23 1234)  sodium chloride 0.9 % bolus 500 mL (0 mLs Intravenous Stopped 09/23/23 1501)  ondansetron (ZOFRAN) injection 4 mg (4 mg Intravenous Given 09/23/23 1238)  morphine (PF) 4 MG/ML injection 4 mg (4 mg Intravenous Given 09/23/23 1322)    ED Course/ Medical Decision Making/ A&P                                 Medical Decision Making Amount and/or Complexity of Data Reviewed Labs: ordered. Radiology: ordered.  Risk Prescription drug management.   This patient presents to the ED for concern of flank pain, this involves an extensive number of treatment options, and is a complaint that carries with it a high risk of complications and morbidity.  The differential diagnosis  includes pyelonephritis, nephrolithiasis, AAA, aortic dissection, ovarian cyst, SBO/LBO, MSK, herpes zoster, other   Co morbidities that complicate the patient evaluation  See HPI   Additional history obtained:  Additional history obtained from EMR External records from outside source obtained and reviewed including hospital records   Lab Tests:  I Ordered, and personally interpreted labs.  The pertinent results include:  UA with rare bacteria, small leukocytes, moderate hemoglobin.  No leukocytosis.  No evidence of anemia.  Platelets within range.  No Electra abnormalities.  No renal dysfunction.  Urine culture pending.   Imaging Studies ordered:  I ordered imaging studies including CT renal stone I independently visualized and interpreted imaging which showed mild left hydroureter immediately precedes a mid to distal cluster of ureteral calculi.  Did show nonobstructing bilateral renal calculi.  Widespread colonic diverticulosis.  Bilateral degenerative facet arthropathy. I agree with the radiologist interpretation  Cardiac Monitoring: / EKG:  The patient was maintained on a cardiac monitor.  I personally viewed and interpreted the cardiac monitored which showed an underlying rhythm of: Sinus rhythm   Consultations Obtained:  N/a   Problem List / ED Course / Critical interventions / Medication management  Ureterolithiasis, flank pain I ordered medication including morphine, Zofran, Toradol, normal saline   Reevaluation of the patient after these medicines showed that the patient improved I have reviewed the patients home medicines and have made adjustments as needed   Social Determinants of Health:  Former cigarette use.  Denies illicit drug use.   Test / Admission - Considered:  Ureterolithiasis, flank pain Vitals signs within normal range and stable throughout visit. Laboratory/imaging studies significant for: See above 53 year old female presents emergency  department complaints of flank pain with radiation down to left lower abdomen.  Pain present since yesterday with acute worsening this morning.  On exam, left-sided CVA tenderness with some tenderness left lower quadrant of abdomen.  Patient with pretty extensive history of kidney stones that stated felt similar.  Workup today overall concerning for urolithiasis.  Laboratory studies showed maintained renal function with UA without gross infection.  UA with few bacteria, small leukocytes; will culture urine and treat empirically following with culture.  CT scan did show evidence of two 6 mm stones on the left side causing obstruction.  Talk to patient about consulting her urologist Dr. Margo Aye while she is in the ED given that there are 2 obstructing stones although with laboratory studies that are reassuring and patient's pain under control.  She  elected to call his office when she got home for follow-up given that they tend to be quick and responding as well as scheduling appointment.  This seemed reasonable.  Will send patient home with nausea medicine, Flomax, medicine for pain and have her follow-up with urology as she already has establish care.  Treatment plan discussed length with patient and she acknowledged understanding was agreeable to said plan.  Patient overall well-appearing, afebrile in no acute distress. Worrisome signs and symptoms were discussed with the patient, and the patient acknowledged understanding to return to the ED if noticed. Patient was stable upon discharge.          Final Clinical Impression(s) / ED Diagnoses Final diagnoses:  Flank pain  Ureterolithiasis    Rx / DC Orders      Peter Garter, [place] 09/23/23 1502    Arby Barrette, MD 09/26/23 [phone]

## 2023-09-23 NOTE — Discharge Instructions (Addendum)
 As discussed, you have 2 kidney stones on the left causing problems.  Both measure about 6 mm in size.  You have several other stones in both of your kidneys.  Will place you on Flomax, pain medicine and nausea medicine to use as needed.  Recommend close follow-up with your urologist in the outpatient setting for reassessment.  Please do not hesitate to return if the worrisome signs and symptoms we discussed become apparent.

## 2023-09-23 NOTE — ED Triage Notes (Signed)
 Pt reports that she has pain left lower quadrant that radiates to the back. Pt reports that she has hx of kidney stones and her pain feels like in the past. States that she was on a heating pad all day yesterday and took a flomax that she had left over from last time.

## 2023-09-24 ENCOUNTER — Encounter: Payer: Self-pay | Admitting: Urology

## 2023-09-24 ENCOUNTER — Ambulatory Visit: Payer: Self-pay | Admitting: Urology

## 2023-09-24 ENCOUNTER — Ambulatory Visit (INDEPENDENT_AMBULATORY_CARE_PROVIDER_SITE_OTHER): Payer: BC Managed Care – PPO | Admitting: Urology

## 2023-09-24 VITALS — BP 129/81 | HR 109

## 2023-09-24 DIAGNOSIS — N200 Calculus of kidney: Secondary | ICD-10-CM

## 2023-09-24 DIAGNOSIS — N201 Calculus of ureter: Secondary | ICD-10-CM | POA: Diagnosis not present

## 2023-09-24 LAB — URINE CULTURE: Culture: NO GROWTH

## 2023-09-24 NOTE — Progress Notes (Signed)
 Assessment: 1. Nephrolithiasis   2. Left ureteral calculus     Plan: Today I had a long discussion with the patient regarding management options for her left ureteral calculi.  We discussed continued efforts at spontaneous stone passage with MET as well as endoscopic management.  Following our discussion, patient would like to try released the next week or so to try and pass the stones but if they have not passed at that time would like to proceed with endoscopic management.  Today I discussed with her cystoscopy, retrograde pyelogram, ureteroscopy with laser lithotripsy and stent placement in detail including potential adverse events or complications.  All questions were answered and patient elects to proceed.  Will schedule for next week. Referral and orders placed.  Chief Complaint: Left flank pain  HPI: Morgan James is a 53 y.o. female who presents for continued evaluation of nephrolithiasis. Patient has a history of recurrent nephrolithiasis.  Please see my note 12/04/2022 at the time of initial visit for detailed history.  At that time the patient presented with a 9 mm right proximal stone and underwent ESWL 11/2022.  The patient subsequently underwent metabolic evaluation and was found to have borderline hypocitraturia and was started on potassium citrate.  She reports that she decided not to take that and also did not return for further follow-up.  She began having left-sided flank pain approximately 3 weeks ago.  The pain intensified over the weekend and she presented to the emergency department yesterday.  Laboratory studies at that time demonstrated a normal CBC and BMP.  Urinalysis was negative for significant blood or infection.  CT stone study independently reviewed by me today shows bilateral nonobstructive renal calculi with mild left hydro down to the level of a cluster ureteral calculi measuring approximately 6 mm near the level of the iliac vessels.  Patient was given pain  medicine, tamsulosin and was instructed to strain her urine.  She presents here today for follow-up.  The pain is eased off somewhat.    Portions of the above documentation were copied from a prior visit for review purposes only.  Allergies: No Known Allergies  PMH: Past Medical History:  Diagnosis Date   AC (acromioclavicular) joint bone spurs    Fibromyalgia    IBS (irritable bowel syndrome)    Kidney stones    Osteoarthritis    Ovarian cyst    Raynaud's disease    Rheumatoid arthritis (HCC)    Sjogren's disease (HCC)     PSH: Past Surgical History:  Procedure Laterality Date   c sections     CHOLECYSTECTOMY     ENDOMETRIAL ABLATION     EXTRACORPOREAL SHOCK WAVE LITHOTRIPSY Right 12/10/2022   Procedure: EXTRACORPOREAL SHOCK WAVE LITHOTRIPSY (ESWL);  Surgeon: [name], MD;  Location: [facility];  Service: Urology;  Laterality: Right;   LITHOTRIPSY      SH: Social History   Tobacco Use   Smoking status: Former    Current packs/day: 0.00    Types: Cigarettes    Quit date: 2016    Years since quitting: 9.1   Smokeless tobacco: Never  Vaping Use   Vaping status: Every Day   Substances: Nicotine  Substance Use Topics   Alcohol use: No   Drug use: No    ROS: Constitutional:  Negative for fever, chills, weight loss CV: Negative for chest pain, previous MI, hypertension Respiratory:  Negative for shortness of breath, wheezing, sleep apnea, frequent cough GI:  Negative for nausea,  vomiting, bloody stool, GERD  PE: BP 129/81   Pulse (!) 109  GENERAL APPEARANCE:  Well appearing, well developed, well nourished, NAD HEENT:  Atraumatic, normocephalic NECK:  Supple without lymphadenopathy  COR:  RR LUNGS:  CTA ABDOMEN:  Soft, non-tender, no masses EXTREMITIES:  Moves all extremities well, without clubbing, cyanosis, or edema NEUROLOGIC:  Alert and oriented x 3, normal gait MENTAL STATUS:  appropriate BACK:  Non-tender to palpation, No  CVAT SKIN:  Warm, dry, and intact   Results: Results for orders placed or performed during the hospital encounter of 09/23/23 (from the past 24 hours)  Urinalysis, Routine w reflex microscopic -Urine, Clean Catch   Collection Time: 09/23/23 12:05 PM  Result Value Ref Range   Color, Urine YELLOW YELLOW   APPearance HAZY (A) CLEAR   Specific Gravity, Urine 1.010 1.005 - 1.030   pH 7.0 5.0 - 8.0   Glucose, UA NEGATIVE NEGATIVE mg/dL   Hgb urine dipstick MODERATE (A) NEGATIVE   Bilirubin Urine NEGATIVE NEGATIVE   Ketones, ur NEGATIVE NEGATIVE mg/dL   Protein, ur NEGATIVE NEGATIVE mg/dL   Nitrite NEGATIVE NEGATIVE   Leukocytes,Ua SMALL (A) NEGATIVE  Urinalysis, Microscopic (reflex)   Collection Time: 09/23/23 12:05 PM  Result Value Ref Range   RBC / HPF 0-5 0 - 5 RBC/hpf   WBC, UA 0-5 0 - 5 WBC/hpf   Bacteria, UA RARE (A) NONE SEEN   Squamous Epithelial / HPF 0-5 0 - 5 /HPF  Basic metabolic panel   Collection Time: 09/23/23 12:40 PM  Result Value Ref Range   Sodium 138 135 - 145 mmol/L   Potassium 3.9 3.5 - 5.1 mmol/L   Chloride 105 98 - 111 mmol/L   CO2 23 22 - 32 mmol/L   Glucose, Bld 98 70 - 99 mg/dL   BUN 10 6 - 20 mg/dL   Creatinine, Ser 6.04 0.44 - 1.00 mg/dL   Calcium 9.4 8.9 - 54.0 mg/dL   GFR, Estimated >98 >11 mL/min   Anion gap 10 5 - 15  CBC   Collection Time: 09/23/23 12:40 PM  Result Value Ref Range   WBC 4.5 4.0 - 10.5 K/uL   RBC 4.03 3.87 - 5.11 MIL/uL   Hemoglobin 12.7 12.0 - 15.0 g/dL   HCT 91.4 78.2 - 95.6 %   MCV 94.8 80.0 - 100.0 fL   MCH 31.5 26.0 - 34.0 pg   MCHC 33.2 30.0 - 36.0 g/dL   RDW 21.3 08.6 - 57.8 %   Platelets 285 150 - 400 K/uL   nRBC 0.0 0.0 - 0.2 %

## 2023-09-24 NOTE — H&P (View-Only) (Signed)
 Assessment: 1. Nephrolithiasis   2. Left ureteral calculus     Plan: Today I had a long discussion with the patient regarding management options for her left ureteral calculi.  We discussed continued efforts at spontaneous stone passage with MET as well as endoscopic management.  Following our discussion, patient would like to try released the next week or so to try and pass the stones but if they have not passed at that time would like to proceed with endoscopic management.  Today I discussed with her cystoscopy, retrograde pyelogram, ureteroscopy with laser lithotripsy and stent placement in detail including potential adverse events or complications.  All questions were answered and patient elects to proceed.  Will schedule for next week. Referral and orders placed.  Chief Complaint: Left flank pain  HPI: Morgan James is a 53 y.o. female who presents for continued evaluation of nephrolithiasis. Patient has a history of recurrent nephrolithiasis.  Please see my note 12/04/2022 at the time of initial visit for detailed history.  At that time the patient presented with a 9 mm right proximal stone and underwent ESWL 11/2022.  The patient subsequently underwent metabolic evaluation and was found to have borderline hypocitraturia and was started on potassium citrate.  She reports that she decided not to take that and also did not return for further follow-up.  She began having left-sided flank pain approximately 3 weeks ago.  The pain intensified over the weekend and she presented to the emergency department yesterday.  Laboratory studies at that time demonstrated a normal CBC and BMP.  Urinalysis was negative for significant blood or infection.  CT stone study independently reviewed by me today shows bilateral nonobstructive renal calculi with mild left hydro down to the level of a cluster ureteral calculi measuring approximately 6 mm near the level of the iliac vessels.  Patient was given pain  medicine, tamsulosin and was instructed to strain her urine.  She presents here today for follow-up.  The pain is eased off somewhat.    Portions of the above documentation were copied from a prior visit for review purposes only.  Allergies: No Known Allergies  PMH: Past Medical History:  Diagnosis Date   AC (acromioclavicular) joint bone spurs    Fibromyalgia    IBS (irritable bowel syndrome)    Kidney stones    Osteoarthritis    Ovarian cyst    Raynaud's disease    Rheumatoid arthritis (HCC)    Sjogren's disease (HCC)     PSH: Past Surgical History:  Procedure Laterality Date   c sections     CHOLECYSTECTOMY     ENDOMETRIAL ABLATION     EXTRACORPOREAL SHOCK WAVE LITHOTRIPSY Right 12/10/2022   Procedure: EXTRACORPOREAL SHOCK WAVE LITHOTRIPSY (ESWL);  Surgeon: [name], MD;  Location: [facility];  Service: Urology;  Laterality: Right;   LITHOTRIPSY      SH: Social History   Tobacco Use   Smoking status: Former    Current packs/day: 0.00    Types: Cigarettes    Quit date: 2016    Years since quitting: 9.1   Smokeless tobacco: Never  Vaping Use   Vaping status: Every Day   Substances: Nicotine  Substance Use Topics   Alcohol use: No   Drug use: No    ROS: Constitutional:  Negative for fever, chills, weight loss CV: Negative for chest pain, previous MI, hypertension Respiratory:  Negative for shortness of breath, wheezing, sleep apnea, frequent cough GI:  Negative for nausea,  vomiting, bloody stool, GERD  PE: BP 129/81   Pulse (!) 109  GENERAL APPEARANCE:  Well appearing, well developed, well nourished, NAD HEENT:  Atraumatic, normocephalic NECK:  Supple without lymphadenopathy  COR:  RR LUNGS:  CTA ABDOMEN:  Soft, non-tender, no masses EXTREMITIES:  Moves all extremities well, without clubbing, cyanosis, or edema NEUROLOGIC:  Alert and oriented x 3, normal gait MENTAL STATUS:  appropriate BACK:  Non-tender to palpation, No  CVAT SKIN:  Warm, dry, and intact   Results: Results for orders placed or performed during the hospital encounter of 09/23/23 (from the past 24 hours)  Urinalysis, Routine w reflex microscopic -Urine, Clean Catch   Collection Time: 09/23/23 12:05 PM  Result Value Ref Range   Color, Urine YELLOW YELLOW   APPearance HAZY (A) CLEAR   Specific Gravity, Urine 1.010 1.005 - 1.030   pH 7.0 5.0 - 8.0   Glucose, UA NEGATIVE NEGATIVE mg/dL   Hgb urine dipstick MODERATE (A) NEGATIVE   Bilirubin Urine NEGATIVE NEGATIVE   Ketones, ur NEGATIVE NEGATIVE mg/dL   Protein, ur NEGATIVE NEGATIVE mg/dL   Nitrite NEGATIVE NEGATIVE   Leukocytes,Ua SMALL (A) NEGATIVE  Urinalysis, Microscopic (reflex)   Collection Time: 09/23/23 12:05 PM  Result Value Ref Range   RBC / HPF 0-5 0 - 5 RBC/hpf   WBC, UA 0-5 0 - 5 WBC/hpf   Bacteria, UA RARE (A) NONE SEEN   Squamous Epithelial / HPF 0-5 0 - 5 /HPF  Basic metabolic panel   Collection Time: 09/23/23 12:40 PM  Result Value Ref Range   Sodium 138 135 - 145 mmol/L   Potassium 3.9 3.5 - 5.1 mmol/L   Chloride 105 98 - 111 mmol/L   CO2 23 22 - 32 mmol/L   Glucose, Bld 98 70 - 99 mg/dL   BUN 10 6 - 20 mg/dL   Creatinine, Ser 6.04 0.44 - 1.00 mg/dL   Calcium 9.4 8.9 - 54.0 mg/dL   GFR, Estimated >98 >11 mL/min   Anion gap 10 5 - 15  CBC   Collection Time: 09/23/23 12:40 PM  Result Value Ref Range   WBC 4.5 4.0 - 10.5 K/uL   RBC 4.03 3.87 - 5.11 MIL/uL   Hemoglobin 12.7 12.0 - 15.0 g/dL   HCT 91.4 78.2 - 95.6 %   MCV 94.8 80.0 - 100.0 fL   MCH 31.5 26.0 - 34.0 pg   MCHC 33.2 30.0 - 36.0 g/dL   RDW 21.3 08.6 - 57.8 %   Platelets 285 150 - 400 K/uL   nRBC 0.0 0.0 - 0.2 %

## 2023-09-25 ENCOUNTER — Other Ambulatory Visit: Payer: Self-pay

## 2023-09-25 ENCOUNTER — Encounter (HOSPITAL_COMMUNITY): Payer: Self-pay | Admitting: Urology

## 2023-09-25 NOTE — Progress Notes (Signed)
 For Anesthesia: PCP - Trisha Mangle, FNP  Cardiologist - N/A  Chest x-[place] - greater than 1 year  in [facility] EKG - greater than 1 year  in [facility] Stress Test - N/A ECHO - N/A Cardiac Cath - N/A Pacemaker/ICD device last checked: N/A Pacemaker orders received: N/A Device Rep notified: N/A  Spinal Cord Stimulator: N/A  Sleep Study - N/A CPAP - N/A  Fasting Blood Sugar - N/A Checks Blood Sugar __N/A___ times a day Date and result of last Hgb A1c-N/A  Last dose of GLP1 agonist- N/A GLP1 instructions: N/A  Last dose of SGLT-2 inhibitors- N/A SGLT-2 instructions:N/A  Blood Thinner Instructions: N/A Aspirin Instructions: N/A Last Dose: N/A  Activity level:  Able to exercise without chest pain and/or shortness of breath   Anesthesia review: N/A  Patient denies shortness of breath, fever, cough and chest pain during pre op phone call.   Patient verbalized understanding of instructions reviewed via telephone.

## 2023-09-30 ENCOUNTER — Telehealth: Payer: Self-pay

## 2023-09-30 ENCOUNTER — Telehealth: Payer: Self-pay | Admitting: Urology

## 2023-09-30 ENCOUNTER — Other Ambulatory Visit: Payer: Self-pay | Admitting: Urology

## 2023-09-30 DIAGNOSIS — N200 Calculus of kidney: Secondary | ICD-10-CM

## 2023-09-30 MED ORDER — OXYCODONE HCL 5 MG PO TABS: 2.5000 mg | ORAL_TABLET | Freq: Four times a day (QID) | ORAL | 0 refills | Status: DC | PRN

## 2023-09-30 MED ORDER — OXYCODONE HCL 5 MG PO TABS
2.5000 mg | ORAL_TABLET | Freq: Four times a day (QID) | ORAL | 0 refills | Status: DC | PRN
Start: 2023-09-30 — End: 2023-10-03

## 2023-09-30 NOTE — Telephone Encounter (Signed)
 Pt called lvm with some question about prep for her surgery and medications that she is supposed to stop. Can you please advise. Thank You.

## 2023-09-30 NOTE — Telephone Encounter (Signed)
 Refill [facility] to [facility]. Please send to correct pharmacy and previous prescription will be canceled.

## 2023-09-30 NOTE — Telephone Encounter (Signed)
 Pt questions was about pain meds which was addressed in the [employer].

## 2023-10-01 ENCOUNTER — Ambulatory Visit: Payer: Self-pay | Admitting: Urology

## 2023-10-01 ENCOUNTER — Encounter (HOSPITAL_COMMUNITY): Payer: Self-pay | Admitting: Urology

## 2023-10-01 ENCOUNTER — Encounter (HOSPITAL_COMMUNITY)
Admission: RE | Disposition: A | Discharge status: Home / Self Care | Payer: Self-pay | Source: Home / Self Care | Attending: Urology

## 2023-10-01 ENCOUNTER — Ambulatory Visit (HOSPITAL_COMMUNITY)
Admission: RE | Admit: 2023-10-01 | Discharge: 2023-10-01 | Disposition: A | Discharge status: Home / Self Care | Payer: BC Managed Care – PPO | Attending: Urology | Admitting: Urology

## 2023-10-01 ENCOUNTER — Ambulatory Visit: Payer: Self-pay

## 2023-10-01 ENCOUNTER — Ambulatory Visit (HOSPITAL_COMMUNITY): Admitting: Anesthesiology

## 2023-10-01 ENCOUNTER — Ambulatory Visit (HOSPITAL_COMMUNITY)

## 2023-10-01 DIAGNOSIS — N201 Calculus of ureter: Secondary | ICD-10-CM

## 2023-10-01 DIAGNOSIS — K449 Diaphragmatic hernia without obstruction or gangrene: Secondary | ICD-10-CM | POA: Diagnosis not present

## 2023-10-01 DIAGNOSIS — I7300 Raynaud's syndrome without gangrene: Secondary | ICD-10-CM | POA: Insufficient documentation

## 2023-10-01 DIAGNOSIS — I10 Essential (primary) hypertension: Secondary | ICD-10-CM | POA: Diagnosis not present

## 2023-10-01 DIAGNOSIS — R519 Headache, unspecified: Secondary | ICD-10-CM | POA: Insufficient documentation

## 2023-10-01 DIAGNOSIS — M069 Rheumatoid arthritis, unspecified: Secondary | ICD-10-CM | POA: Diagnosis not present

## 2023-10-01 DIAGNOSIS — F17290 Nicotine dependence, other tobacco product, uncomplicated: Secondary | ICD-10-CM | POA: Insufficient documentation

## 2023-10-01 DIAGNOSIS — Z87442 Personal history of urinary calculi: Secondary | ICD-10-CM | POA: Insufficient documentation

## 2023-10-01 DIAGNOSIS — M797 Fibromyalgia: Secondary | ICD-10-CM | POA: Insufficient documentation

## 2023-10-01 HISTORY — DX: Asymptomatic varicose veins of unspecified lower extremity: I83.90

## 2023-10-01 HISTORY — PX: CYSTOSCOPY WITH RETROGRADE PYELOGRAM, URETEROSCOPY AND STENT PLACEMENT: SHX5789

## 2023-10-01 HISTORY — DX: Personal history of other diseases of the digestive system: Z87.19

## 2023-10-01 SURGERY — CYSTOURETEROSCOPY, WITH RETROGRADE PYELOGRAM AND STENT INSERTION
Anesthesia: General | Laterality: Left

## 2023-10-01 MED ORDER — FENTANYL CITRATE PF 50 MCG/ML IJ SOSY
25.0000 ug | PREFILLED_SYRINGE | INTRAMUSCULAR | Status: DC | PRN
  Administered 2023-10-01: 50 ug via INTRAVENOUS

## 2023-10-01 MED ORDER — MIDAZOLAM HCL 5 MG/5ML IJ SOLN
INTRAMUSCULAR | Status: DC | PRN
  Administered 2023-10-01: 2 mg via INTRAVENOUS

## 2023-10-01 MED ORDER — DEXAMETHASONE SODIUM PHOSPHATE 4 MG/ML IJ SOLN
INTRAMUSCULAR | Status: DC | PRN
  Administered 2023-10-01: 10 mg via INTRAVENOUS

## 2023-10-01 MED ORDER — OXYCODONE HCL 5 MG PO TABS
ORAL_TABLET | ORAL | Status: AC
  Administered 2023-10-01: 5 mg via ORAL
  Filled 2023-10-01: qty 1

## 2023-10-01 MED ORDER — LIDOCAINE HCL (CARDIAC) PF 100 MG/5ML IV SOSY
PREFILLED_SYRINGE | INTRAVENOUS | Status: DC | PRN
  Administered 2023-10-01: 60 mg via INTRAVENOUS

## 2023-10-01 MED ORDER — FENTANYL CITRATE (PF) 100 MCG/2ML IJ SOLN
INTRAMUSCULAR | Status: DC | PRN
  Administered 2023-10-01 (×2): 50 ug via INTRAVENOUS

## 2023-10-01 MED ORDER — FENTANYL CITRATE PF 50 MCG/ML IJ SOSY
PREFILLED_SYRINGE | INTRAMUSCULAR | Status: AC
  Administered 2023-10-01: 50 ug via INTRAVENOUS
  Filled 2023-10-01: qty 1

## 2023-10-01 MED ORDER — CEFTRIAXONE SODIUM 1 G IJ SOLR
1.0000 g | INTRAMUSCULAR | Status: AC
  Administered 2023-10-01: 1 g via INTRAVENOUS
  Filled 2023-10-01: qty 10

## 2023-10-01 MED ORDER — EPHEDRINE 5 MG/ML INJ
INTRAVENOUS | Status: AC
  Filled 2023-10-01: qty 10

## 2023-10-01 MED ORDER — PROPOFOL 10 MG/ML IV BOLUS
INTRAVENOUS | Status: DC | PRN
  Administered 2023-10-01: 200 mg via INTRAVENOUS

## 2023-10-01 MED ORDER — EPHEDRINE SULFATE (PRESSORS) 50 MG/ML IJ SOLN
INTRAMUSCULAR | Status: DC | PRN
  Administered 2023-10-01 (×2): 7.5 mg via INTRAVENOUS
  Administered 2023-10-01: 5 mg via INTRAVENOUS

## 2023-10-01 MED ORDER — LACTATED RINGERS IV SOLN: INTRAVENOUS | Status: DC

## 2023-10-01 MED ORDER — ORAL CARE MOUTH RINSE: 15.0000 mL | Freq: Once | OROMUCOSAL | Status: AC

## 2023-10-01 MED ORDER — FENTANYL CITRATE PF 50 MCG/ML IJ SOSY
PREFILLED_SYRINGE | INTRAMUSCULAR | Status: AC
  Administered 2023-10-01: 50 ug via INTRAVENOUS
  Filled 2023-10-01: qty 2

## 2023-10-01 MED ORDER — AMISULPRIDE (ANTIEMETIC) 5 MG/2ML IV SOLN: 10.0000 mg | Freq: Once | INTRAVENOUS | Status: DC | PRN

## 2023-10-01 MED ORDER — FENTANYL CITRATE (PF) 100 MCG/2ML IJ SOLN
INTRAMUSCULAR | Status: AC
  Filled 2023-10-01: qty 2

## 2023-10-01 MED ORDER — CHLORHEXIDINE GLUCONATE 0.12 % MT SOLN
15.0000 mL | Freq: Once | OROMUCOSAL | Status: AC
  Administered 2023-10-01: 15 mL via OROMUCOSAL

## 2023-10-01 MED ORDER — DEXMEDETOMIDINE HCL IN NACL 80 MCG/20ML IV SOLN
INTRAVENOUS | Status: DC | PRN
  Administered 2023-10-01: 4 ug via INTRAVENOUS

## 2023-10-01 MED ORDER — SODIUM CHLORIDE 0.9 % IR SOLN
Status: DC | PRN
  Administered 2023-10-01: 3000 mL via INTRAVESICAL

## 2023-10-01 MED ORDER — OXYCODONE HCL 5 MG PO TABS: 5.0000 mg | ORAL_TABLET | Freq: Once | ORAL | Status: AC | PRN

## 2023-10-01 MED ORDER — MIDAZOLAM HCL 2 MG/2ML IJ SOLN
INTRAMUSCULAR | Status: AC
  Filled 2023-10-01: qty 2

## 2023-10-01 MED ORDER — IOHEXOL 300 MG/ML  SOLN
INTRAMUSCULAR | Status: DC | PRN
  Administered 2023-10-01: 10 mL

## 2023-10-01 MED ORDER — ONDANSETRON HCL 4 MG/2ML IJ SOLN
INTRAMUSCULAR | Status: DC | PRN
Start: 2023-10-01 — End: 2023-10-01
  Administered 2023-10-01: 4 mg via INTRAVENOUS

## 2023-10-01 SURGICAL SUPPLY — 21 items
BAG URO CATCHER STRL LF (MISCELLANEOUS) ×1 IMPLANT
BASKET ZERO TIP NITINOL 2.4FR (BASKET) IMPLANT
CATH URETERAL DUAL LUMEN 10F (MISCELLANEOUS) IMPLANT
CATH URETL OPEN END 6FR 70 (CATHETERS) IMPLANT
CLOTH BEACON ORANGE TIMEOUT ST (SAFETY) ×1 IMPLANT
FIBER LASER MOSES 200 DFL (Laser) IMPLANT
FIBER LASER MOSES 365 DFL (Laser) IMPLANT
GLOVE BIO SURGEON STRL SZ8.5 (GLOVE) ×2 IMPLANT
GOWN STRL REUS W/ TWL XL LVL3 (GOWN DISPOSABLE) ×1 IMPLANT
GUIDEWIRE AMPLATZ STIFF 0.35 (WIRE) IMPLANT
GUIDEWIRE STR DUAL SENSOR (WIRE) ×1 IMPLANT
GUIDEWIRE SUPER STIFF (WIRE) IMPLANT
GUIDEWIRE ZIPWRE .038 STRAIGHT (WIRE) IMPLANT
KIT TURNOVER KIT A (KITS) IMPLANT
MANIFOLD NEPTUNE II (INSTRUMENTS) ×1 IMPLANT
PACK CYSTO (CUSTOM PROCEDURE TRAY) ×1 IMPLANT
SHEATH NAVIGATOR HD 11/13X28 (SHEATH) IMPLANT
SHEATH NAVIGATOR HD 11/13X36 (SHEATH) IMPLANT
STENT URET 6FRX24 CONTOUR (STENTS) IMPLANT
TUBING CONNECTING 10 (TUBING) ×1 IMPLANT
TUBING UROLOGY SET (TUBING) ×1 IMPLANT

## 2023-10-01 NOTE — Anesthesia Preprocedure Evaluation (Signed)
 Anesthesia Evaluation  Patient identified by MRN, date of birth, ID band Patient awake    Reviewed: Allergy & Precautions, NPO status , Patient's Chart, lab work & pertinent test results  Airway Mallampati: II  TM Distance: >3 FB Neck ROM: Full    Dental  (+) Dental Advisory Given   Pulmonary Patient abstained from smoking., former smoker   breath sounds clear to auscultation       Cardiovascular hypertension,  Rhythm:Regular Rate:Normal     Neuro/Psych  Headaches  Neuromuscular disease    GI/Hepatic Neg liver ROS, hiatal hernia,,,  Endo/Other  negative endocrine ROS    Renal/GU Renal disease     Musculoskeletal  (+) Arthritis ,  Fibromyalgia -  Abdominal   Peds  Hematology negative hematology ROS (+)   Anesthesia Other Findings   Reproductive/Obstetrics                             Anesthesia Physical Anesthesia Plan  ASA: 2  Anesthesia Plan: General   Post-op Pain Management: Tylenol PO (pre-op)*   Induction: Intravenous  PONV Risk Score and Plan: 3 and Dexamethasone, Ondansetron, Midazolam and Treatment may vary due to age or medical condition  Airway Management Planned: LMA  Additional Equipment:   Intra-op Plan:   Post-operative Plan: Extubation in OR  Informed Consent: I have reviewed the patients History and Physical, chart, labs and discussed the procedure including the risks, benefits and alternatives for the proposed anesthesia with the patient or authorized representative who has indicated his/her understanding and acceptance.     Dental advisory given  Plan Discussed with: CRNA  Anesthesia Plan Comments:        Anesthesia Quick Evaluation

## 2023-10-01 NOTE — Interval H&P Note (Signed)
 History and Physical Interval Note:  10/01/2023 12:37 PM  Morgan James  has presented today for surgery, with the diagnosis of left ureteral stone.  The various methods of treatment have been discussed with the patient and family. After consideration of risks, benefits and other options for treatment, the patient has consented to  Procedure(s): CYSTOSCOPY WITH RETROGRADE PYELOGRAM, URETEROSCOPY AND STENT PLACEMENT (Left) as a surgical intervention.  The patient's history has been reviewed, patient examined, no change in status, stable for surgery.  I have reviewed the patient's chart and labs.  Questions were answered to the patient's satisfaction.     [name]

## 2023-10-01 NOTE — Anesthesia Postprocedure Evaluation (Signed)
 Anesthesia Post Note  Patient: Morgan James  Procedure(s) Performed: CYSTOSCOPY WITH RETROGRADE PYELOGRAM, URETEROSCOPY AND STENT PLACEMENT (Left)     Patient location during evaluation: PACU Anesthesia Type: General Level of consciousness: awake and alert Pain management: pain level controlled Vital Signs Assessment: post-procedure vital signs reviewed and stable Respiratory status: spontaneous breathing, nonlabored ventilation, respiratory function stable and patient connected to nasal cannula oxygen Cardiovascular status: blood pressure returned to baseline and stable Postop Assessment: no apparent nausea or vomiting Anesthetic complications: no  No notable events documented.  Last Vitals:  Vitals:   10/01/23 1615 10/01/23 1622  BP: 120/79 125/81  Pulse: 73 78  Resp: 17 15  Temp:  36.5 C  SpO2: 94% 100%    Last Pain:  Vitals:   10/01/23 1622  PainSc: 3                  Kennieth Rad

## 2023-10-01 NOTE — Transfer of Care (Signed)
 Immediate Anesthesia Transfer of Care Note  Patient: Morgan James  Procedure(s) Performed: Procedure(s): CYSTOSCOPY WITH RETROGRADE PYELOGRAM, URETEROSCOPY AND STENT PLACEMENT (Left)  Patient Location: PACU  Anesthesia Type:General  Level of Consciousness:  sedated, patient cooperative and responds to stimulation  Airway & Oxygen Therapy:Patient Spontanous Breathing and Patient connected to face mask oxgen  Post-op Assessment:  Report given to PACU RN and Post -op Vital signs reviewed and stable  Post vital signs:  Reviewed and stable  Last Vitals:  Vitals:   10/01/23 1512  BP: 124/71  Pulse: 66  SpO2: 100%    Complications: No apparent anesthesia complications

## 2023-10-01 NOTE — Anesthesia Procedure Notes (Signed)
 Procedure Name: LMA Insertion Date/Time: 10/01/2023 2:27 PM  Performed by: Theodosia Quay, CRNAPre-anesthesia Checklist: Patient identified, Emergency Drugs available, Suction available, Patient being monitored and Timeout performed Patient Re-evaluated:Patient Re-evaluated prior to induction Oxygen Delivery Method: Circle system utilized Preoxygenation: Pre-oxygenation with 100% oxygen Induction Type: IV induction Ventilation: Mask ventilation without difficulty LMA: LMA inserted LMA Size: 4.0 Number of attempts: 1 Placement Confirmation: positive ETCO2 and breath sounds checked- equal and bilateral Tube secured with: Tape Dental Injury: Teeth and Oropharynx as per pre-operative assessment

## 2023-10-01 NOTE — Op Note (Signed)
 OPERATIVE NOTE   Patient Name: Morgan James  MRN: [id number]   Date of Procedure: 10/01/23    Preoperative diagnosis:  Left ureteral calculi  Postoperative diagnosis:  same  Procedure:  Cystoscopy, left retrograde ureteropyelogram with intraoperative fluoroscopy and real-time interpretation of images, left ureteroscopy with laser lithotripsy; placement of left 6 x 24 cm DJ stent with tether.  Attending: Concepcion Living, MD  Anesthesia: general  Estimated blood loss: minimal  Fluids: Per anesthesia record  Drains: left dj stent  Specimens: stone for analysis  Antibiotics: rocephin  Findings: three back to back stone in the distal ureter on the left just below the iliac vessels  Indications:  Ureteral calculi  Description of Procedure:  The patient was brought to the operating room where she was correctly identified and underwent satisfactory induction of general anesthesia.  She was then positioned in the modified dorsolithotomy position and her external genitalia were prepped and draped in the usual sterile fashion.  Preprocedural timeout was performed.  Cystoscopy was subsequently performed with a 21 [place] panendoscope.  The bladder appeared to be normal. A left retrograde ureteropyelogram was subsequently performed which demonstrated a filling defect within the distal ureter below the iliac vessels consistent with her stone.  These could also be seen on plain films prior to retrograde injection.  The proximal ureter was moderately dilated as was the intrarenal collecting system.  At this time under endoscopic and fluoroscopic control was able to place a safety wire beyond the stones to eventually coil within the renal collecting system.  I then deployed a short 11/13 access sheath and just dilated the ureteral orifice and a short segment of intramural ureter.  At this time the semirigid ureteroscope was deployed.  I was able to easily enter the ureter and identify the  stones.  The leading stone was 5 to 6 mm.  I deployed the holmium laser 360 m fiber was able to break the stone into 3-4 small pieces several of which were washed out into the bladder.  The neck stone was a little smaller probably 3 to 4 mm which was also broken up.  The proximal stone was again approximately 6 mm.  I used the laser to also fragment this stone to several small pieces.  I then deployed the 0 tip nitinol basket and removed all the remaining stones within the ureter.  Several were obtained and sent for stone analysis.  The remainder were released within the bladder lumen to void out.  After all of the stones were removed over the existing safety wire under endoscopic and fluoroscopic control I placed a 6 x 4 cm double-J stent with tether attached.  The tether was tucked into the vagina.  The bladder was drained.  The procedure was terminated.  The patient tolerated the procedure well.  There were no apparent complications.  Complications: None  Condition: Stable, extubated, transferred to PACU  Plan: FU 1 week for stent removal

## 2023-10-02 ENCOUNTER — Other Ambulatory Visit: Payer: Self-pay

## 2023-10-02 ENCOUNTER — Other Ambulatory Visit: Payer: Self-pay | Admitting: Urology

## 2023-10-02 ENCOUNTER — Encounter (HOSPITAL_COMMUNITY): Payer: Self-pay | Admitting: Urology

## 2023-10-02 DIAGNOSIS — N200 Calculus of kidney: Secondary | ICD-10-CM

## 2023-10-02 MED ORDER — OXYBUTYNIN CHLORIDE 5 MG PO TABS: 5.0000 mg | ORAL_TABLET | Freq: Three times a day (TID) | ORAL | 0 refills | Status: DC | PRN

## 2023-10-03 ENCOUNTER — Other Ambulatory Visit: Payer: Self-pay | Admitting: Urology

## 2023-10-03 DIAGNOSIS — N200 Calculus of kidney: Secondary | ICD-10-CM

## 2023-10-03 MED ORDER — OXYCODONE HCL 5 MG PO TABS: 2.5000 mg | ORAL_TABLET | Freq: Four times a day (QID) | ORAL | 0 refills | Status: DC | PRN

## 2023-10-04 ENCOUNTER — Telehealth: Payer: Self-pay | Admitting: Urology

## 2023-10-04 NOTE — Telephone Encounter (Signed)
 Left msg for matt to return call.

## 2023-10-04 NOTE — Telephone Encounter (Signed)
 matt with dianon lab specimen question  [phone]

## 2023-10-04 NOTE — Telephone Encounter (Signed)
 Spoke with [place], who wanted to verify the collection date for the specimen.

## 2023-10-07 ENCOUNTER — Telehealth: Payer: Self-pay

## 2023-10-07 ENCOUNTER — Encounter: Payer: Self-pay | Admitting: Urology

## 2023-10-07 NOTE — Telephone Encounter (Signed)
 Patient wants to pull the stent herself at home. She verbalized the need for a 4-6 weeks follow up with a renal US prior.   She also stated that the potassium citrate pills are too big for her to swallow and she wants the liquid called into [facility]

## 2023-10-07 NOTE — Telephone Encounter (Signed)
-----   Message from [name] sent at 10/07/2023 11:58 AM EDT ----- Regarding: RE: next Tue appt She can pull it herself.  Only issue is I would prefer her to come here for that so we can check urine and provide antibiotic if needed.  If she pulls herself she needs to come in in 4-6 weeks for FU with renal US prior.  Let me know if I need to order [place]. thanks ----- Message ----- From: Carolin Coy, CMA Sent: 10/04/2023  11:51 AM EDT To: [name], MD Subject: RE: next Tue appt                              You had a 930 opening so I moved her to that slot. She wanted to know if she could just pull the stent herself. She stated that she has done it before. ----- Message ----- From: [name], MD Sent: 10/04/2023  10:49 AM EST To: Carolin Coy, CMA Subject: next Tue appt                                  I have a 1230 surgery start time next Tuesday Last week I held up OR because I was late. For a 1230 start I need to be at hospital 30 min prior to start to do the preop and consent  Please have Tecora come in at 830am for the stent removal if she can so my last patient is scheduled at 11.  She has a tether so it will be easy

## 2023-10-08 ENCOUNTER — Encounter: Payer: BC Managed Care – PPO | Admitting: Urology

## 2023-10-08 ENCOUNTER — Other Ambulatory Visit: Payer: Self-pay | Admitting: Urology

## 2023-10-08 DIAGNOSIS — N200 Calculus of kidney: Secondary | ICD-10-CM

## 2023-10-08 MED ORDER — POTASSIUM CITRATE-CITRIC ACID 1100-334 MG/5ML PO SOLN: 10.0000 meq | Freq: Three times a day (TID) | ORAL | 3 refills | Status: DC

## 2023-11-04 ENCOUNTER — Ambulatory Visit (HOSPITAL_BASED_OUTPATIENT_CLINIC_OR_DEPARTMENT_OTHER)

## 2023-11-07 ENCOUNTER — Encounter: Payer: Self-pay | Admitting: Family Medicine

## 2023-11-11 ENCOUNTER — Ambulatory Visit (HOSPITAL_BASED_OUTPATIENT_CLINIC_OR_DEPARTMENT_OTHER)
Admission: RE | Admit: 2023-11-11 | Discharge: 2023-11-11 | Disposition: A | Discharge status: Home / Self Care | Source: Ambulatory Visit | Attending: Urology | Admitting: Urology

## 2023-11-11 DIAGNOSIS — N200 Calculus of kidney: Secondary | ICD-10-CM | POA: Insufficient documentation

## 2023-12-30 ENCOUNTER — Other Ambulatory Visit: Payer: Self-pay | Admitting: Family Medicine

## 2023-12-30 DIAGNOSIS — Z1231 Encounter for screening mammogram for malignant neoplasm of breast: Secondary | ICD-10-CM

## 2024-01-24 DIAGNOSIS — Z1231 Encounter for screening mammogram for malignant neoplasm of breast: Secondary | ICD-10-CM

## 2024-01-27 ENCOUNTER — Encounter

## 2024-01-27 DIAGNOSIS — Z1231 Encounter for screening mammogram for malignant neoplasm of breast: Secondary | ICD-10-CM

## 2024-02-24 ENCOUNTER — Ambulatory Visit

## 2024-04-13 ENCOUNTER — Ambulatory Visit

## 2024-05-11 ENCOUNTER — Other Ambulatory Visit: Payer: Self-pay

## 2024-05-11 ENCOUNTER — Emergency Department (HOSPITAL_COMMUNITY)

## 2024-05-11 ENCOUNTER — Encounter (HOSPITAL_COMMUNITY): Payer: Self-pay | Admitting: Emergency Medicine

## 2024-05-11 ENCOUNTER — Emergency Department (HOSPITAL_COMMUNITY)
Admission: EM | Admit: 2024-05-11 | Discharge: 2024-05-11 | Disposition: A | Discharge status: Home / Self Care | Attending: Emergency Medicine | Admitting: Emergency Medicine

## 2024-05-11 DIAGNOSIS — R739 Hyperglycemia, unspecified: Secondary | ICD-10-CM | POA: Diagnosis not present

## 2024-05-11 DIAGNOSIS — N200 Calculus of kidney: Secondary | ICD-10-CM

## 2024-05-11 DIAGNOSIS — R1032 Left lower quadrant pain: Secondary | ICD-10-CM | POA: Diagnosis present

## 2024-05-11 DIAGNOSIS — N132 Hydronephrosis with renal and ureteral calculous obstruction: Secondary | ICD-10-CM | POA: Insufficient documentation

## 2024-05-11 DIAGNOSIS — N23 Unspecified renal colic: Secondary | ICD-10-CM

## 2024-05-11 DIAGNOSIS — Z7982 Long term (current) use of aspirin: Secondary | ICD-10-CM | POA: Insufficient documentation

## 2024-05-11 DIAGNOSIS — N201 Calculus of ureter: Secondary | ICD-10-CM

## 2024-05-11 LAB — URINALYSIS, ROUTINE W REFLEX MICROSCOPIC
Bacteria, UA: NONE SEEN
Bilirubin Urine: NEGATIVE
Glucose, UA: NEGATIVE mg/dL
Ketones, ur: 5 mg/dL — AB
Nitrite: NEGATIVE
Protein, ur: NEGATIVE mg/dL
Specific Gravity, Urine: 1.014 (ref 1.005–1.030)
pH: 5 (ref 5.0–8.0)

## 2024-05-11 LAB — CBC WITH DIFFERENTIAL/PLATELET
Abs Immature Granulocytes: 0.01 K/uL (ref 0.00–0.07)
Basophils Absolute: 0 K/uL (ref 0.0–0.1)
Basophils Relative: 1 %
Eosinophils Absolute: 0.1 K/uL (ref 0.0–0.5)
Eosinophils Relative: 2 %
HCT: 37.6 % (ref 36.0–46.0)
Hemoglobin: 12.6 g/dL (ref 12.0–15.0)
Immature Granulocytes: 0 %
Lymphocytes Relative: 19 %
Lymphs Abs: 1.3 K/uL (ref 0.7–4.0)
MCH: 32.1 pg (ref 26.0–34.0)
MCHC: 33.5 g/dL (ref 30.0–36.0)
MCV: 95.7 fL (ref 80.0–100.0)
Monocytes Absolute: 0.4 K/uL (ref 0.1–1.0)
Monocytes Relative: 5 %
Neutro Abs: 4.7 K/uL (ref 1.7–7.7)
Neutrophils Relative %: 73 %
Platelets: 263 K/uL (ref 150–400)
RBC: 3.93 MIL/uL (ref 3.87–5.11)
RDW: 12.4 % (ref 11.5–15.5)
WBC: 6.4 K/uL (ref 4.0–10.5)
nRBC: 0 % (ref 0.0–0.2)

## 2024-05-11 LAB — BASIC METABOLIC PANEL WITH GFR
Anion gap: 11 (ref 5–15)
BUN: 17 mg/dL (ref 6–20)
CO2: 23 mmol/L (ref 22–32)
Calcium: 9.2 mg/dL (ref 8.9–10.3)
Chloride: 106 mmol/L (ref 98–111)
Creatinine, Ser: 0.88 mg/dL (ref 0.44–1.00)
GFR, Estimated: >60 mL/min (ref >60)
Glucose, Bld: 104 mg/dL — ABNORMAL HIGH (ref 70–99)
Potassium: 3.5 mmol/L (ref 3.5–5.1)
Sodium: 139 mmol/L (ref 135–145)

## 2024-05-11 MED ORDER — SODIUM CHLORIDE 0.9 % IV BOLUS
1000.0000 mL | Freq: Once | INTRAVENOUS | Status: AC
  Administered 2024-05-11: 1000 mL via INTRAVENOUS

## 2024-05-11 MED ORDER — ONDANSETRON 4 MG PO TBDP: 4.0000 mg | ORAL_TABLET | Freq: Three times a day (TID) | ORAL | 0 refills | Status: AC | PRN

## 2024-05-11 MED ORDER — TAMSULOSIN HCL 0.4 MG PO CAPS: 0.4000 mg | ORAL_CAPSULE | Freq: Every day | ORAL | 0 refills | Status: DC

## 2024-05-11 MED ORDER — KETOROLAC TROMETHAMINE 30 MG/ML IJ SOLN
30.0000 mg | Freq: Once | INTRAMUSCULAR | Status: AC
  Administered 2024-05-11: 30 mg via INTRAVENOUS
  Filled 2024-05-11: qty 1

## 2024-05-11 MED ORDER — OXYCODONE HCL 5 MG PO TABS: 2.5000 mg | ORAL_TABLET | Freq: Four times a day (QID) | ORAL | 0 refills | Status: DC | PRN

## 2024-05-11 MED ORDER — ONDANSETRON HCL 4 MG/2ML IJ SOLN
4.0000 mg | Freq: Once | INTRAMUSCULAR | Status: AC
  Administered 2024-05-11: 4 mg via INTRAVENOUS
  Filled 2024-05-11: qty 2

## 2024-05-11 NOTE — ED Notes (Signed)
 ED Provider at bedside.

## 2024-05-11 NOTE — ED Notes (Signed)
 Patient transported to CT

## 2024-05-11 NOTE — ED Triage Notes (Signed)
 Pt c/o left flank pain and vomiting that started yesterday.

## 2024-05-11 NOTE — ED Provider Notes (Signed)
 [place] EMERGENCY DEPARTMENT AT [facility] Provider Note   CSN: [id number] Arrival date & time: 05/11/24  0118     Patient presents with: Flank Pain   Morgan James is a 53 y.o. female.   The history is provided by the patient.  Flank Pain   She has history of rheumatoid arthritis, kidney stones and comes in complaining of left lower quadrant pain which started this morning and has gotten worse through the day.  There is some radiation toward the left flank.  There is associated nausea and vomiting.  This is typical of pain she has had with kidney stones.  She denies fever or chills.  She has taken [facility] powder without any benefit.  She used Phenergan  suppository without any benefit.    Prior to Admission medications   Medication Sig Start Date End Date Taking? Authorizing Provider  citric acid -potassium citrate  (POLYCITRA) 1100-334 MG/5ML solution Take 5 mLs (10 mEq total) by mouth 3 (three) times daily. 10/08/23   Shona Layman BROCKS, MD  oxybutynin  (DITROPAN ) 5 MG tablet Take 1 tablet (5 mg total) by mouth every 8 (eight) hours as needed for bladder spasms. 10/02/23   Shona Layman BROCKS, MD  Aspirin-Acetaminophen -Caffeine (GOODY HEADACHE PO) Take 2 tablets by mouth daily.    [provider]  buPROPion (WELLBUTRIN XL) 300 MG 24 hr tablet Take 300 mg by mouth every evening.    [provider]  cefadroxil  (DURICEF) 500 MG capsule Take 1 capsule (500 mg total) by mouth 2 (two) times daily. 09/23/23   Silver Wonda LABOR, PA  DULoxetine (CYMBALTA) 30 MG capsule Take 30 mg by mouth every evening.    [provider]  ketorolac  (TORADOL ) 10 MG tablet Take 1 tablet (10 mg total) by mouth every 6 (six) hours as needed. 09/23/23   Silver Wonda LABOR, PA  ondansetron  (ZOFRAN -ODT) 4 MG disintegrating tablet Take 1 tablet (4 mg total) by mouth every 8 (eight) hours as needed. 09/23/23   Silver Wonda LABOR, PA  oxyCODONE  (ROXICODONE ) 5 MG immediate release tablet Take 0.5  tablets (2.5 mg total) by mouth every 6 (six) hours as needed for severe pain (pain score 7-10). 10/03/23   Shona Layman BROCKS, MD  promethazine  (PHENERGAN ) 25 MG suppository Place 1 suppository (25 mg total) rectally every 6 (six) hours as needed for nausea or vomiting. 09/23/23   Silver Wonda LABOR, PA  tamsulosin  (FLOMAX ) 0.4 MG CAPS capsule Take 1 capsule (0.4 mg total) by mouth daily. 09/23/23   Silver Wonda LABOR, PA  traZODone (DESYREL) 50 MG tablet Take 50 mg by mouth at bedtime.    [provider]    Allergies: Patient has no known allergies.    Review of Systems  Genitourinary:  Positive for flank pain.  All other systems reviewed and are negative.   Updated Vital Signs BP 137/84   Pulse 98   Temp 98.2 F (36.8 C)   Resp 16   Ht 5' 2 (1.575 m)   Wt 61.2 kg   SpO2 96%   BMI 24.68 kg/m   Physical Exam Vitals and nursing note reviewed.   53 year old female, resting comfortably and in no acute distress. Vital signs are normal. Oxygen saturation is 96%, which is normal. Head is normocephalic and atraumatic. PERRLA, EOMI. Back is nontender in the midline.  There is moderate left CVA tenderness. Lungs are clear without rales, wheezes, or rhonchi. Chest is nontender. Heart has regular rate and rhythm without murmur. Abdomen  is soft, flat, with moderate left lower quadrant tenderness.  There is no rebound or guarding. Extremities have no cyanosis or edema, full range of motion is present. Skin is warm and dry without rash. Neurologic: Mental status is normal, cranial nerves are intact, moves all extremities equally.  (all labs ordered are listed, but only abnormal results are displayed) Labs Reviewed  BASIC METABOLIC PANEL WITH GFR - Abnormal; Notable for the following components:      Result Value   Glucose, Bld 104 (*)    All other components within normal limits  URINALYSIS, ROUTINE W REFLEX MICROSCOPIC - Abnormal; Notable for the following components:   Hgb urine  dipstick MODERATE (*)    Ketones, ur 5 (*)    Leukocytes,Ua SMALL (*)    Crystals PRESENT (*)    All other components within normal limits  CBC WITH DIFFERENTIAL/PLATELET     Radiology: CT Renal Stone Study Result Date: 05/11/2024 CLINICAL DATA:  Left-sided flank pain EXAM: CT ABDOMEN AND PELVIS WITHOUT CONTRAST TECHNIQUE: Multidetector CT imaging of the abdomen and pelvis was performed following the standard protocol without IV contrast. RADIATION DOSE REDUCTION: This exam was performed according to the departmental dose-optimization program which includes automated exposure control, adjustment of the mA and/or kV according to patient size and/or use of iterative reconstruction technique. COMPARISON:  09/23/2023 FINDINGS: Lower chest: No acute abnormality. Hepatobiliary: No focal liver abnormality is seen. Status post cholecystectomy. No biliary dilatation. Pancreas: Pancreas is within normal limits. Spleen: Normal in size without focal abnormality. Adrenals/Urinary Tract: Adrenal glands are unremarkable. Nonobstructing right renal calculi are seen in the lower pole. Left-sided hydronephrosis is noted secondary to a 6 mm stone in the mid left ureter. Nonobstructing calculi in the left kidney are noted measuring up to 5 mm. Bladder is decompressed. Stomach/Bowel: No obstructive or inflammatory changes of the colon are seen. Diverticular changes noted without evidence of diverticulitis. The appendix is unremarkable. Small bowel and stomach are within normal limits. Vascular/Lymphatic: No significant vascular findings are present. No enlarged abdominal or pelvic lymph nodes. Reproductive: Uterus and bilateral adnexa are unremarkable. Other: No abdominal wall hernia or abnormality. No abdominopelvic ascites. Musculoskeletal: No acute or significant osseous findings. IMPRESSION: 6 mm left mid ureteral stone with hydronephrosis. Nonobstructing bilateral renal calculi left greater than right. Diverticulosis  without diverticulitis. Electronically Signed   By: Oneil Devonshire M.D.   On: 05/11/2024 02:22     Procedures   Medications Ordered in the ED  sodium chloride  0.9 % bolus 1,000 mL (0 mLs Intravenous Stopped 05/11/24 0445)  ondansetron  (ZOFRAN ) injection 4 mg (4 mg Intravenous Given 05/11/24 0153)  ketorolac  (TORADOL ) 30 MG/ML injection 30 mg (30 mg Intravenous Given 05/11/24 0154)                                    Medical Decision Making Amount and/or Complexity of Data Reviewed Labs: ordered. Radiology: ordered.  Risk Prescription drug management.   Left lower quadrant and flank pain in a patient with history of kidney stones.  This is a presentation which has a wide range of treatment options and carries with it high risk of morbidity and complications.  Differential diagnosis includes, but is not limited to, urolithiasis with renal colic, pyelonephritis, diverticulitis, abdominal aortic aneurysm.  I have reviewed her past records and note hospital admission for 10/01/2023 for laser lithotripsy of left ureteral calculus.  Renal stone CT scan on  09/23/2023 identified 3 nonobstructing right renal calculi and 7 nonobstructing left renal calculi.  I have ordered ketorolac  for pain, ondansetron  for nausea and I have ordered renal stone protocol CT scan as well as screening labs and IV fluids.  She had good relief of pain with above-noted treatment.  I have reviewed her laboratory tests, and my interpretation is microscopic hematuria with crystals present consistent with kidney stone, mildly elevated random glucose level which will need to be followed as an outpatient, normal CBC.  CT scan shows 6 mm left mid ureteral calculus with hydronephrosis and also with bilateral nonobstructing renal calculi and incidental finding of diverticulosis.  Have independently viewed the images, and agree with radiologist's interpretation.  I am discharging her with prescriptions for tamsulosin , ondansetron  oral  dissolving tablet, oxycodone  and I am referring her to urology for follow-up.  Strict return precautions were discussed-especially if she develops a fever.     Final diagnoses:  Renal colic on left side  Ureterolithiasis  Elevated random blood glucose level    ED Discharge Orders          Ordered    oxyCODONE  (ROXICODONE ) 5 MG immediate release tablet  Every 6 hours PRN        05/11/24 0532    tamsulosin  (FLOMAX ) 0.4 MG CAPS capsule  Daily        05/11/24 0532    ondansetron  (ZOFRAN -ODT) 4 MG disintegrating tablet  Every 8 hours PRN        05/11/24 0532               Raford Lenis, MD 05/11/24 [id number]

## 2024-05-11 NOTE — Discharge Instructions (Addendum)
 Call the urology office today for a follow-up appointment.  It is likely that your stone will need some help for it to pass.  Do not take ibuprofen  until urologist says it is acceptable.  Return to the emergency department if pain is not being adequately controlled, or if you start running a fever.

## 2024-05-12 ENCOUNTER — Other Ambulatory Visit: Payer: Self-pay

## 2024-05-12 ENCOUNTER — Emergency Department (HOSPITAL_COMMUNITY)

## 2024-05-12 ENCOUNTER — Encounter (HOSPITAL_COMMUNITY): Payer: Self-pay

## 2024-05-12 ENCOUNTER — Emergency Department (HOSPITAL_COMMUNITY)
Admission: EM | Admit: 2024-05-12 | Discharge: 2024-05-12 | Disposition: A | Discharge status: Home / Self Care | Attending: Emergency Medicine | Admitting: Emergency Medicine

## 2024-05-12 DIAGNOSIS — R1030 Lower abdominal pain, unspecified: Secondary | ICD-10-CM | POA: Diagnosis present

## 2024-05-12 DIAGNOSIS — N200 Calculus of kidney: Secondary | ICD-10-CM

## 2024-05-12 DIAGNOSIS — N23 Unspecified renal colic: Secondary | ICD-10-CM

## 2024-05-12 DIAGNOSIS — N202 Calculus of kidney with calculus of ureter: Secondary | ICD-10-CM | POA: Diagnosis not present

## 2024-05-12 LAB — URINALYSIS, ROUTINE W REFLEX MICROSCOPIC
Bilirubin Urine: NEGATIVE
Glucose, UA: NEGATIVE mg/dL
Ketones, ur: 5 mg/dL — AB
Leukocytes,Ua: NEGATIVE
Nitrite: NEGATIVE
Protein, ur: NEGATIVE mg/dL
Specific Gravity, Urine: 1.006 (ref 1.005–1.030)
pH: 6 (ref 5.0–8.0)

## 2024-05-12 LAB — BASIC METABOLIC PANEL WITH GFR
Anion gap: 9 (ref 5–15)
BUN: 14 mg/dL (ref 6–20)
CO2: 24 mmol/L (ref 22–32)
Calcium: 9.2 mg/dL (ref 8.9–10.3)
Chloride: 105 mmol/L (ref 98–111)
Creatinine, Ser: 0.9 mg/dL (ref 0.44–1.00)
GFR, Estimated: >60 mL/min (ref >60)
Glucose, Bld: 112 mg/dL — ABNORMAL HIGH (ref 70–99)
Potassium: 3.9 mmol/L (ref 3.5–5.1)
Sodium: 139 mmol/L (ref 135–145)

## 2024-05-12 LAB — CBC WITH DIFFERENTIAL/PLATELET
Abs Immature Granulocytes: 0.03 K/uL (ref 0.00–0.07)
Basophils Absolute: 0 K/uL (ref 0.0–0.1)
Basophils Relative: 0 %
Eosinophils Absolute: 0 K/uL (ref 0.0–0.5)
Eosinophils Relative: 0 %
HCT: 37.5 % (ref 36.0–46.0)
Hemoglobin: 12.6 g/dL (ref 12.0–15.0)
Immature Granulocytes: 0 %
Lymphocytes Relative: 10 %
Lymphs Abs: 0.7 K/uL (ref 0.7–4.0)
MCH: 32.4 pg (ref 26.0–34.0)
MCHC: 33.6 g/dL (ref 30.0–36.0)
MCV: 96.4 fL (ref 80.0–100.0)
Monocytes Absolute: 0.4 K/uL (ref 0.1–1.0)
Monocytes Relative: 6 %
Neutro Abs: 5.5 K/uL (ref 1.7–7.7)
Neutrophils Relative %: 84 %
Platelets: 263 K/uL (ref 150–400)
RBC: 3.89 MIL/uL (ref 3.87–5.11)
RDW: 12.4 % (ref 11.5–15.5)
WBC: 6.7 K/uL (ref 4.0–10.5)
nRBC: 0 % (ref 0.0–0.2)

## 2024-05-12 MED ORDER — ONDANSETRON HCL 4 MG/2ML IJ SOLN
4.0000 mg | Freq: Once | INTRAMUSCULAR | Status: AC
Start: 2024-05-12 — End: 2024-05-12
  Administered 2024-05-12: 4 mg via INTRAVENOUS
  Filled 2024-05-12: qty 2

## 2024-05-12 MED ORDER — SODIUM CHLORIDE 0.9 % IV BOLUS
1000.0000 mL | Freq: Once | INTRAVENOUS | Status: AC
  Administered 2024-05-12: 1000 mL via INTRAVENOUS

## 2024-05-12 MED ORDER — HYDROMORPHONE HCL 1 MG/ML IJ SOLN
1.0000 mg | Freq: Once | INTRAMUSCULAR | Status: AC
  Administered 2024-05-12: 1 mg via INTRAVENOUS
  Filled 2024-05-12: qty 1

## 2024-05-12 MED ORDER — KETOROLAC TROMETHAMINE 30 MG/ML IJ SOLN
15.0000 mg | Freq: Once | INTRAMUSCULAR | Status: AC
  Administered 2024-05-12: 15 mg via INTRAVENOUS
  Filled 2024-05-12: qty 1

## 2024-05-12 NOTE — Assessment & Plan Note (Signed)
 6mm left mid-distal ureteral stone (dx 05/11/24)  Weaker visibility on KUB  We reviewed recent clinical history, lab data and pertinent imaging today. Based on the size and location of their stone, I would recommend medical expulsive therapy and a trial of passage, at least initially.  She preferred this approach as well-she has had multiple prior procedures and wants to avoid if possible. Provided adequate symptomatic control and an absence of infectious signs, I typically allow 3-4 weeks for spontaneous passage. Return precautions were verbalized, including new fevers/chills, intractable N/V, inability to tolerate PO liquids, or severe refractory pain.   - MET x 1-2 additional weeks - Flomax  0.4mg  prescribed (#30 tabs) - r/b/SEs reviewed - PO Toradol  + PO pyridium added  - Clinic / on-call info provided - Will request left ESWL procedure date in 2 weeks, serve as Print production planner.  She understands that we will need to get additional KUB the morning of, confirm stone visibility.  If unable to confidently identify, we would have to to abort procedure.

## 2024-05-12 NOTE — ED Provider Notes (Signed)
 [place] EMERGENCY DEPARTMENT AT [facility] Provider Note   CSN: [id number] Arrival date & time: 05/12/24  1033     Patient presents with: Abdominal Pain   Morgan James is a 53 y.o. female presenting with worsened left flank and lower abdominal pain.  She was seen here 2 2 evenings ago where she was diagnosed with a 6 mm mid left ureteral kidney stone, has been taking oxycodone  along with Flomax  without significant improvement in pain.  She denies fevers or chills, no nausea or vomiting, denies dysuria.  She is scheduled to see urology in [place] tomorrow at 1 PM.    The history is provided by the patient.       Prior to Admission medications   Medication Sig Start Date End Date Taking? Authorizing Provider  citric acid -potassium citrate  (POLYCITRA) 1100-334 MG/5ML solution Take 5 mLs (10 mEq total) by mouth 3 (three) times daily. 10/08/23   Shona Layman BROCKS, MD  oxybutynin  (DITROPAN ) 5 MG tablet Take 1 tablet (5 mg total) by mouth every 8 (eight) hours as needed for bladder spasms. 10/02/23   Shona Layman BROCKS, MD  Aspirin-Acetaminophen -Caffeine (GOODY HEADACHE PO) Take 2 tablets by mouth daily.    [provider]  buPROPion (WELLBUTRIN XL) 300 MG 24 hr tablet Take 300 mg by mouth every evening.    [provider]  cefadroxil  (DURICEF) 500 MG capsule Take 1 capsule (500 mg total) by mouth 2 (two) times daily. 09/23/23   Silver Wonda LABOR, PA  DULoxetine (CYMBALTA) 30 MG capsule Take 30 mg by mouth every evening.    [provider]  ketorolac  (TORADOL ) 10 MG tablet Take 1 tablet (10 mg total) by mouth every 6 (six) hours as needed. 09/23/23   Silver Wonda LABOR, PA  ondansetron  (ZOFRAN -ODT) 4 MG disintegrating tablet Take 1 tablet (4 mg total) by mouth every 8 (eight) hours as needed. 05/11/24   Raford Lenis, MD  oxyCODONE  (ROXICODONE ) 5 MG immediate release tablet Take 0.5 tablets (2.5 mg total) by mouth every 6 (six) hours as needed for severe pain  (pain score 7-10). 05/11/24   Raford Lenis, MD  promethazine  (PHENERGAN ) 25 MG suppository Place 1 suppository (25 mg total) rectally every 6 (six) hours as needed for nausea or vomiting. 09/23/23   Silver Wonda LABOR, PA  tamsulosin  (FLOMAX ) 0.4 MG CAPS capsule Take 1 capsule (0.4 mg total) by mouth daily. 05/11/24   Raford Lenis, MD  traZODone (DESYREL) 50 MG tablet Take 50 mg by mouth at bedtime.    [provider]    Allergies: Patient has no known allergies.    Review of Systems  Constitutional:  Negative for chills and fever.  HENT:  Negative for congestion.   Eyes: Negative.   Respiratory:  Negative for chest tightness and shortness of breath.   Cardiovascular:  Negative for chest pain.  Gastrointestinal:  Positive for abdominal pain and nausea. Negative for vomiting.  Genitourinary:  Positive for flank pain. Negative for dysuria.  Musculoskeletal:  Negative for arthralgias, joint swelling and neck pain.  Skin: Negative.  Negative for rash and wound.  Neurological:  Negative for dizziness, weakness, light-headedness, numbness and headaches.  Psychiatric/Behavioral: Negative.      Updated Vital Signs BP 119/69   Pulse 72   Temp 98.1 F (36.7 C) (Oral)   Resp 18   Ht 5' 3 (1.6 m)   Wt 63.5 kg   SpO2 95%   BMI 24.80 kg/m   Physical Exam Vitals  and nursing note reviewed.  Constitutional:      Appearance: She is well-developed.  HENT:     Head: Normocephalic and atraumatic.  Eyes:     Conjunctiva/sclera: Conjunctivae normal.  Cardiovascular:     Rate and Rhythm: Normal rate and regular rhythm.     Heart sounds: Normal heart sounds.  Pulmonary:     Effort: Pulmonary effort is normal.     Breath sounds: Normal breath sounds. No wheezing.  Abdominal:     General: Bowel sounds are normal.     Palpations: Abdomen is soft.     Tenderness: There is no abdominal tenderness.  Musculoskeletal:        General: Normal range of motion.     Cervical back: Normal  range of motion.  Skin:    General: Skin is warm and dry.  Neurological:     Mental Status: She is alert.     (all labs ordered are listed, but only abnormal results are displayed) Labs Reviewed  BASIC METABOLIC PANEL WITH GFR - Abnormal; Notable for the following components:      Result Value   Glucose, Bld 112 (*)    All other components within normal limits  URINALYSIS, ROUTINE W REFLEX MICROSCOPIC - Abnormal; Notable for the following components:   Color, Urine STRAW (*)    Hgb urine dipstick MODERATE (*)    Ketones, ur 5 (*)    Bacteria, UA RARE (*)    All other components within normal limits  CBC WITH DIFFERENTIAL/PLATELET    EKG: None  Radiology: DG Abdomen 1 View Result Date: 05/12/2024 EXAM: 1 VIEW XRAY OF THE ABDOMEN 05/12/2024 11:15:00 AM COMPARISON: 12/19/2022 CLINICAL HISTORY: assess for uretal stone movement. Hx of kidney stones. FINDINGS: BOWEL: Nonobstructive bowel gas pattern. Moderate stool burden in the ascending colon. SOFT TISSUES: Cholecystectomy clips noted. Right nephrolithiasis. Likely an additional 5 mm calculus overlying the lower pole of the left kidney which was not well visualized on the prior study due to overlying bowel gas. Additional calculi in the left hemipelvis are increased in number compared to prior with ureteral calculi difficult to exclude. BONES: No acute osseous abnormality. IMPRESSION: 1. Right nephrolithiasis. 2. Likely additional 5 mm calculus overlying the lower pole of the left kidney, not well visualized on prior study due to overlying bowel gas. 3. Increased number of calculi in the left hemipelvis compared to prior, concerning for distal ureteral calculi. Electronically signed by: Donnice Mania MD 05/12/2024 12:14 PM EDT RP Workstation: [id number]   CT Renal Stone Study Result Date: 05/11/2024 CLINICAL DATA:  Left-sided flank pain EXAM: CT ABDOMEN AND PELVIS WITHOUT CONTRAST TECHNIQUE: Multidetector CT imaging of the abdomen and  pelvis was performed following the standard protocol without IV contrast. RADIATION DOSE REDUCTION: This exam was performed according to the departmental dose-optimization program which includes automated exposure control, adjustment of the mA and/or kV according to patient size and/or use of iterative reconstruction technique. COMPARISON:  09/23/2023 FINDINGS: Lower chest: No acute abnormality. Hepatobiliary: No focal liver abnormality is seen. Status post cholecystectomy. No biliary dilatation. Pancreas: Pancreas is within normal limits. Spleen: Normal in size without focal abnormality. Adrenals/Urinary Tract: Adrenal glands are unremarkable. Nonobstructing right renal calculi are seen in the lower pole. Left-sided hydronephrosis is noted secondary to a 6 mm stone in the mid left ureter. Nonobstructing calculi in the left kidney are noted measuring up to 5 mm. Bladder is decompressed. Stomach/Bowel: No obstructive or inflammatory changes of the colon are seen. Diverticular changes noted  without evidence of diverticulitis. The appendix is unremarkable. Small bowel and stomach are within normal limits. Vascular/Lymphatic: No significant vascular findings are present. No enlarged abdominal or pelvic lymph nodes. Reproductive: Uterus and bilateral adnexa are unremarkable. Other: No abdominal wall hernia or abnormality. No abdominopelvic ascites. Musculoskeletal: No acute or significant osseous findings. IMPRESSION: 6 mm left mid ureteral stone with hydronephrosis. Nonobstructing bilateral renal calculi left greater than right. Diverticulosis without diverticulitis. Electronically Signed   By: Oneil Devonshire M.D.   On: 05/11/2024 02:22     Procedures   Medications Ordered in the ED  HYDROmorphone  (DILAUDID ) injection 1 mg (1 mg Intravenous Given 05/12/24 1137)  ondansetron  (ZOFRAN ) injection 4 mg (4 mg Intravenous Given 05/12/24 1136)  sodium chloride  0.9 % bolus 1,000 mL (0 mLs Intravenous Stopped 05/12/24  1332)  ketorolac  (TORADOL ) 30 MG/ML injection 15 mg (15 mg Intravenous Given 05/12/24 1329)                                    Medical Decision Making Patient with known left 6 mm ureteral stone from ED visit early yesterday morning with worsening pain.  She states she left here and significant pain and never was able to get the medicine to adequately control her left flank pain.  She has had no fevers, no dysuria, no nausea or vomiting.  She is scheduled to see Dr. Perla tomorrow in [place].  Repeat labs today are reassuring, her kidney function remains normal and she has no UTI.  A plain KUB was obtained, suggesting persistent distal ureteral calculi, remaining bilateral renal calculi as well.  Patient was given IV fluids and a dose of Dilaudid  after which her pain completely resolved.  She was given a dose of Toradol  also prior to discharge home.  Encouraged to take her oxycodone  before her pain escalates above a 4 out of 10 for hopeful better pain control.  She was given return precautions, also suggested [facility] as an option if she needs emergency care before her urology appointment tomorrow since Dr. Perla works out of [facility].  Amount and/or Complexity of Data Reviewed Labs: ordered.    Details: Labs reviewed including be met, CBC and urinalysis, kidney function remains normal with a creatinine of 0.90, she has a normal WBC count at 6.7, her urine revealing moderate hemoglobin, negative nitrites, not negative leukocytes, rare bacteria. Radiology: ordered.    Details: KUB per above.  CT imaging from 10/13 was also reviewed.  Risk Decision regarding hospitalization.        Final diagnoses:  Ureteral colic  Kidney stones    ED Discharge Orders     None          Birdena Mliss RIGGERS 05/12/24 1345    Dean Mliss, MD 05/12/24 1458

## 2024-05-12 NOTE — Discharge Instructions (Signed)
 Your labs today are reassuring.  I recommend taking your pain medication before your pain gets severe as discussed which will better help you control your pain at home.  Continue using your Flomax  and drink plenty of fluids to avoid dehydration.  Plan to see Dr. Georganne tomorrow as was previously scheduled.

## 2024-05-12 NOTE — Progress Notes (Unsigned)
   05/13/2024 1:33 PM   Morgan James Dec 28, 1970 [id number]  Reason for visit: Follow up nephrolithiasis   HPI: 53 y.o. female, initial follow up with me today, previously seen by Dr. Shona Law history of nephrolithiasis ED Visit yesterday 05/11/24 - acute left flank pain CT Stone - 6 mm left mid ureteral stone with hydronephrosis Discharged on MET  Ongoing left flank pain, seen again in ED last night Today she is doing fairly well, not as bad although having ongoing pain She is taking only oxycodone  and Flomax , not taking Tylenol  or NSAIDs  She has a urine strainer No fevers chills, no nausea vomiting       Prior HPI: Recurrent nephrolithiasis    - s/p Left URS/LL (Mar 2025)    - 24-hr urine (May 2024) -hypocitraturia, prescribed Kcit 15meq BID   - s/p Right ESWL (May 2024)    Physical Exam: BP (!) 142/87   Pulse (!) 105   Ht 5' 3 (1.6 m)   Wt 140 lb (63.5 kg)   BMI 24.80 kg/m    Constitutional:  Alert and oriented, No acute distress.  Laboratory Data: UA (05/12/24) - negative    Latest Reference Range & Units 05/12/24 11:26  Creatinine 0.44 - 1.00 mg/dL 9.09    Pertinent Imaging: I have personally viewed and interpreted the CT Stone 05/11/24 - multiple bilateral nonobstructive stones. 6mm mid to distal Left ureteral stone with proximal hydronephrosis. .    Assessment & Plan:    Nephrolithiasis Assessment & Plan: 6mm left mid-distal ureteral stone (dx 05/11/24)  Weaker visibility on KUB  We reviewed recent clinical history, lab data and pertinent imaging today. Based on the size and location of their stone, I would recommend medical expulsive therapy and a trial of passage, at least initially.  She preferred this approach as well-she has had multiple prior procedures and wants to avoid if possible. Provided adequate symptomatic control and an absence of infectious signs, I typically allow 3-4 weeks for spontaneous passage. Return precautions were  verbalized, including new fevers/chills, intractable N/V, inability to tolerate PO liquids, or severe refractory pain.   - MET x 1-2 additional weeks - Flomax  0.4mg  prescribed (#30 tabs) - r/b/SEs reviewed - PO Toradol  + PO pyridium added  - Clinic / on-call info provided - Will request left ESWL procedure date in 2 weeks, serve as Print production planner.  She understands that we will need to get additional KUB the morning of, confirm stone visibility.  If unable to confidently identify, we would have to to abort procedure.    Orders: -     Ambulatory Referral For Surgery Scheduling  Other orders -     Ketorolac  Tromethamine ; Take 1 tablet (10 mg total) by mouth every 6 (six) hours as needed.  Dispense: 20 tablet; Refill: 0 -     Tamsulosin  HCl; Take 1 capsule (0.4 mg total) by mouth daily.  Dispense: 10 capsule; Refill: 0 -     Phenazopyridine HCl; Take 1 tablet (100 mg total) by mouth 3 (three) times daily as needed for pain.  Dispense: 10 tablet; Refill: 0       Morgan JONELLE Skye, MD  [facility] [address] [place], [place] [address] [phone]

## 2024-05-12 NOTE — ED Triage Notes (Signed)
 Pt to er, pt states that she was here Monday and was dx with a 6mm kidney stone, states that she has an appointment with the urologist tomorrow, but she is here today for intense pain.

## 2024-05-13 ENCOUNTER — Ambulatory Visit: Admitting: Urology

## 2024-05-13 ENCOUNTER — Other Ambulatory Visit: Payer: Self-pay

## 2024-05-13 VITALS — BP 142/87 | HR 105 | Ht 63.0 in | Wt 140.0 lb

## 2024-05-13 DIAGNOSIS — N200 Calculus of kidney: Secondary | ICD-10-CM

## 2024-05-13 DIAGNOSIS — N201 Calculus of ureter: Secondary | ICD-10-CM

## 2024-05-13 MED ORDER — TAMSULOSIN HCL 0.4 MG PO CAPS: 0.4000 mg | ORAL_CAPSULE | Freq: Every day | ORAL | 0 refills | Status: DC

## 2024-05-13 MED ORDER — PHENAZOPYRIDINE HCL 100 MG PO TABS
100.0000 mg | ORAL_TABLET | Freq: Three times a day (TID) | ORAL | 0 refills | Status: DC | PRN
Start: 2024-05-13 — End: 2024-06-11

## 2024-05-13 MED ORDER — KETOROLAC TROMETHAMINE 10 MG PO TABS: 10.0000 mg | ORAL_TABLET | Freq: Four times a day (QID) | ORAL | 0 refills | Status: DC | PRN

## 2024-05-13 NOTE — Progress Notes (Signed)
 ESWL ORDER FORM  Expected date of procedure: ~ 2 weeks  Surgeon: any available  Post op standing: 2-4wk follow up w/KUB prior  Anticoagulation/Aspirin/NSAID standing order: Hold all 72 hours prior  Anesthesia standing order: MAC  VTE standing: SCD's  Dx: Left Ureteral Stone  Procedure: left Extracorporeal shock wave lithotripsy  CPT : 50590  Standing Order Set:   *NPO after mn, KUB  *NS 145ml/hr, Keflex 500mg  PO, Benadryl  25mg  PO, Valium  10mg  PO, Zofran  4mg  IV  Medications if other than standing orders:   N/A

## 2024-05-14 ENCOUNTER — Other Ambulatory Visit: Payer: Self-pay

## 2024-05-14 DIAGNOSIS — N200 Calculus of kidney: Secondary | ICD-10-CM

## 2024-05-18 NOTE — Progress Notes (Deleted)
   05/22/2024 6:58 AM   Morgan James 22-Jan-1971 [id number]  Reason for visit: Follow up left ureteral stone   HPI: 53 y.o. female, follow up with me today Had recommend MET +/- ESWL in ~2 weeks if non-passage KUB 05/12/24 - persistent 5mm left distal ureteral stone (nearby caudal calcification is likely phlebolith or adhered to vaginal cuff--seen on prior CT)    Prior HPI: Long history of nephrolithiasis ED Visit yesterday 05/11/24 - acute left flank pain CT Stone - 6 mm left mid ureteral stone with hydronephrosis Discharged on MET    Physical Exam: There were no vitals taken for this visit.   Constitutional:  Alert and oriented, No acute distress.  Laboratory Data: UA 05/12/24 - negative  Pertinent Imaging: I have personally viewed and interpreted the KUB 05/12/24 - persistent 5mm left distal ureteral stone (nearby caudal calcification is likely phlebolith or adhered to vaginal cuff--seen on prior CT)    Assessment & Plan:    Nephrolithiasis Assessment & Plan: 6mm left mid-distal ureteral stone (dx 05/11/24)   She completed an interval KUB on 05/12/2024 (this was completed prematurely)-although it does redemonstrate a persistent 6 mm left distal ureteral stone.  The stone is better characterized compared to prior KUB spot with CT. I do think it is amenable to ESWL.  Nearby calcification (medial and caudal)-is likely phlebolith, or adherent to vaginal cuff-visualized on recent CT.   - ESWL vs URS/LL         Morgan JONELLE Skye, MD  [facility] [address], [address] [place], [place] [address] [phone]

## 2024-05-18 NOTE — Assessment & Plan Note (Deleted)
 6mm left mid-distal ureteral stone (dx 05/11/24)   She completed an interval KUB on 05/12/2024 (this was completed prematurely)-although it does redemonstrate a persistent 6 mm left distal ureteral stone.  The stone is better characterized compared to prior KUB spot with CT. I do think it is amenable to ESWL.  Nearby calcification (medial and caudal)-is likely phlebolith, or adherent to vaginal cuff-visualized on recent CT.   - ESWL vs URS/LL

## 2024-05-19 ENCOUNTER — Ambulatory Visit

## 2024-05-22 ENCOUNTER — Ambulatory Visit: Admitting: Urology

## 2024-05-22 DIAGNOSIS — N200 Calculus of kidney: Secondary | ICD-10-CM

## 2024-05-27 NOTE — Progress Notes (Signed)
 No showed to appointment- will call us  to schedule Litho if needed.

## 2024-06-11 DIAGNOSIS — N200 Calculus of kidney: Secondary | ICD-10-CM

## 2024-06-11 MED ORDER — KETOROLAC TROMETHAMINE 10 MG PO TABS: 10.0000 mg | ORAL_TABLET | Freq: Four times a day (QID) | ORAL | 0 refills | Status: AC | PRN

## 2024-06-11 MED ORDER — PHENAZOPYRIDINE HCL 100 MG PO TABS: 100.0000 mg | ORAL_TABLET | Freq: Three times a day (TID) | ORAL | 0 refills | Status: AC | PRN

## 2024-06-11 MED ORDER — TAMSULOSIN HCL 0.4 MG PO CAPS: 0.4000 mg | ORAL_CAPSULE | Freq: Every day | ORAL | 1 refills | Status: AC

## 2024-06-11 MED ORDER — OXYCODONE HCL 5 MG PO TABS: 5.0000 mg | ORAL_TABLET | Freq: Four times a day (QID) | ORAL | 0 refills | Status: AC | PRN

## 2024-06-22 ENCOUNTER — Other Ambulatory Visit: Payer: Self-pay

## 2024-06-22 ENCOUNTER — Emergency Department (HOSPITAL_COMMUNITY)
Admission: EM | Admit: 2024-06-22 | Discharge: 2024-06-22 | Disposition: A | Discharge status: Home / Self Care | Payer: Self-pay | Attending: Emergency Medicine | Admitting: Emergency Medicine

## 2024-06-22 ENCOUNTER — Emergency Department (HOSPITAL_COMMUNITY): Payer: Self-pay

## 2024-06-22 ENCOUNTER — Encounter (HOSPITAL_COMMUNITY): Payer: Self-pay

## 2024-06-22 DIAGNOSIS — Z7982 Long term (current) use of aspirin: Secondary | ICD-10-CM | POA: Insufficient documentation

## 2024-06-22 DIAGNOSIS — N200 Calculus of kidney: Secondary | ICD-10-CM

## 2024-06-22 DIAGNOSIS — R748 Abnormal levels of other serum enzymes: Secondary | ICD-10-CM | POA: Insufficient documentation

## 2024-06-22 DIAGNOSIS — N132 Hydronephrosis with renal and ureteral calculous obstruction: Secondary | ICD-10-CM | POA: Insufficient documentation

## 2024-06-22 DIAGNOSIS — Z79899 Other long term (current) drug therapy: Secondary | ICD-10-CM | POA: Insufficient documentation

## 2024-06-22 DIAGNOSIS — I10 Essential (primary) hypertension: Secondary | ICD-10-CM | POA: Insufficient documentation

## 2024-06-22 LAB — URINALYSIS, ROUTINE W REFLEX MICROSCOPIC
Bacteria, UA: NONE SEEN
Bilirubin Urine: NEGATIVE
Glucose, UA: NEGATIVE mg/dL
Ketones, ur: NEGATIVE mg/dL
Leukocytes,Ua: NEGATIVE
Nitrite: POSITIVE — AB
Protein, ur: NEGATIVE mg/dL
Specific Gravity, Urine: 1.004 — ABNORMAL LOW (ref 1.005–1.030)
pH: 6 (ref 5.0–8.0)

## 2024-06-22 LAB — CBC WITH DIFFERENTIAL/PLATELET
Abs Immature Granulocytes: 0 K/uL (ref 0.00–0.07)
Basophils Absolute: 0 K/uL (ref 0.0–0.1)
Basophils Relative: 1 %
Eosinophils Absolute: 0.1 K/uL (ref 0.0–0.5)
Eosinophils Relative: 2 %
HCT: 38.1 % (ref 36.0–46.0)
Hemoglobin: 12.8 g/dL (ref 12.0–15.0)
Immature Granulocytes: 0 %
Lymphocytes Relative: 32 %
Lymphs Abs: 1.4 K/uL (ref 0.7–4.0)
MCH: 31.7 pg (ref 26.0–34.0)
MCHC: 33.6 g/dL (ref 30.0–36.0)
MCV: 94.3 fL (ref 80.0–100.0)
Monocytes Absolute: 0.3 K/uL (ref 0.1–1.0)
Monocytes Relative: 7 %
Neutro Abs: 2.7 K/uL (ref 1.7–7.7)
Neutrophils Relative %: 58 %
Platelets: 277 K/uL (ref 150–400)
RBC: 4.04 MIL/uL (ref 3.87–5.11)
RDW: 12.1 % (ref 11.5–15.5)
WBC: 4.6 K/uL (ref 4.0–10.5)
nRBC: 0 % (ref 0.0–0.2)

## 2024-06-22 LAB — COMPREHENSIVE METABOLIC PANEL WITH GFR
ALT: 12 U/L (ref 0–44)
AST: 19 U/L (ref 15–41)
Albumin: 4.5 g/dL (ref 3.5–5.0)
Alkaline Phosphatase: 68 U/L (ref 38–126)
Anion gap: 9 (ref 5–15)
BUN: 18 mg/dL (ref 6–20)
CO2: 26 mmol/L (ref 22–32)
Calcium: 9.4 mg/dL (ref 8.9–10.3)
Chloride: 105 mmol/L (ref 98–111)
Creatinine, Ser: 0.74 mg/dL (ref 0.44–1.00)
GFR, Estimated: >60 mL/min (ref >60)
Glucose, Bld: 80 mg/dL (ref 70–99)
Potassium: 3.9 mmol/L (ref 3.5–5.1)
Sodium: 140 mmol/L (ref 135–145)
Total Bilirubin: 0.2 mg/dL (ref 0.0–1.2)
Total Protein: 6.6 g/dL (ref 6.5–8.1)

## 2024-06-22 LAB — LIPASE, BLOOD: Lipase: 63 U/L — ABNORMAL HIGH (ref 11–51)

## 2024-06-22 MED ORDER — KETOROLAC TROMETHAMINE 15 MG/ML IJ SOLN
15.0000 mg | Freq: Once | INTRAMUSCULAR | Status: AC
  Administered 2024-06-22: 15 mg via INTRAVENOUS
  Filled 2024-06-22: qty 1

## 2024-06-22 MED ORDER — MORPHINE SULFATE (PF) 4 MG/ML IV SOLN
4.0000 mg | Freq: Once | INTRAVENOUS | Status: AC
  Administered 2024-06-22: 4 mg via INTRAVENOUS
  Filled 2024-06-22: qty 1

## 2024-06-22 MED ORDER — OXYCODONE-ACETAMINOPHEN 5-325 MG PO TABS
1.0000 | ORAL_TABLET | Freq: Four times a day (QID) | ORAL | 0 refills | Status: AC | PRN
Start: 2024-06-22 — End: ?

## 2024-06-22 MED ORDER — CEPHALEXIN 500 MG PO CAPS
500.0000 mg | ORAL_CAPSULE | Freq: Once | ORAL | Status: AC
  Administered 2024-06-22: 500 mg via ORAL
  Filled 2024-06-22: qty 1

## 2024-06-22 MED ORDER — CEFADROXIL 500 MG PO CAPS: 500.0000 mg | ORAL_CAPSULE | Freq: Two times a day (BID) | ORAL | 0 refills | Status: AC

## 2024-06-22 NOTE — Discharge Instructions (Addendum)
 Please use Tylenol  or ibuprofen  for pain.  You may use 600 mg ibuprofen  every 6 hours or 1000 mg of Tylenol  every 6 hours.  You may choose to alternate between the 2.  This would be most effective.  Not to exceed 4 g of Tylenol  within 24 hours.  Not to exceed 3200 mg ibuprofen  24 hours.  You can use the stronger narcotic pain medication in place of Tylenol  for severe break through pain.  If you take the narcotic pain medication that we prescribed recommend that you also take a laxative such as MiraLAX or Dulcolax every day that you take the narcotic pain medicine, and drink plenty of fluids, 50 to 64 ounces to prevent any constipation.  Take the antibiotics that I prescribed, follow-up closely with your urologist, return for worsening pain, fever despite treatment.

## 2024-06-22 NOTE — ED Provider Notes (Signed)
 [place] EMERGENCY DEPARTMENT AT [facility] Provider Note   CSN: [id number] Arrival date & time: 06/22/24  [id number]     Patient presents with: Flank Pain   Carlin A Branca is a 53 y.o. female with past medical history significant for chronic pain, hypertension, recurrent kidney stones who presents with concern for left flank pain, recent diagnosis of a 6 mm stone on 10/13.  She reports that she has discussed surgical removal with urologist but has been trying to pass it on her own.  She reports that she does not have insurance that takes effect until December 1 and she is trying to avoid any procedure until been due to cost.  He does report that she had some slight darkening and cloudy appearance of her urine in the last 2 to 3 days, she began taking AZO    Flank Pain       Prior to Admission medications   Medication Sig Start Date End Date Taking? Authorizing Provider  cefadroxil  (DURICEF) 500 MG capsule Take 1 capsule (500 mg total) by mouth 2 (two) times daily. 06/22/24  Yes Leor Whyte H, PA-C  oxyCODONE -acetaminophen  (PERCOCET/ROXICET) 5-325 MG tablet Take 1 tablet by mouth every 6 (six) hours as needed for severe pain (pain score 7-10). 06/22/24  Yes Haddie Bruhl H, PA-C  Aspirin-Acetaminophen -Caffeine (GOODY HEADACHE PO) Take 2 tablets by mouth daily.    [provider]  buPROPion (WELLBUTRIN XL) 300 MG 24 hr tablet Take 300 mg by mouth every evening.    [provider]  citric acid -potassium citrate  (POLYCITRA) 1100-334 MG/5ML solution Take 5 mLs (10 mEq total) by mouth 3 (three) times daily. 10/08/23   Shona Layman BROCKS, MD  DULoxetine (CYMBALTA) 30 MG capsule Take 30 mg by mouth every evening.    [provider]  ketorolac  (TORADOL ) 10 MG tablet Take 1 tablet (10 mg total) by mouth every 6 (six) hours as needed for up to 14 days. 06/11/24 06/25/24  Georganne Penne SAUNDERS, MD  ondansetron  (ZOFRAN -ODT) 4 MG disintegrating tablet Take 1  tablet (4 mg total) by mouth every 8 (eight) hours as needed. 05/11/24   Raford Lenis, MD  oxybutynin  (DITROPAN ) 5 MG tablet Take 1 tablet (5 mg total) by mouth every 8 (eight) hours as needed for bladder spasms. Patient not taking: Reported on 05/13/2024 10/02/23   Shona Layman BROCKS, MD  phenazopyridine  (PYRIDIUM ) 100 MG tablet Take 1 tablet (100 mg total) by mouth 3 (three) times daily as needed for pain. 06/11/24   Georganne Penne SAUNDERS, MD  promethazine  (PHENERGAN ) 25 MG suppository Place 1 suppository (25 mg total) rectally every 6 (six) hours as needed for nausea or vomiting. Patient not taking: Reported on 05/13/2024 09/23/23   Silver Wonda LABOR, PA  tamsulosin  (FLOMAX ) 0.4 MG CAPS capsule Take 1 capsule (0.4 mg total) by mouth daily. 06/11/24   Georganne Penne SAUNDERS, MD  traZODone (DESYREL) 50 MG tablet Take 50 mg by mouth at bedtime.    [provider]    Allergies: Patient has no known allergies.    Review of Systems  Genitourinary:  Positive for flank pain.  All other systems reviewed and are negative.   Updated Vital Signs BP 122/78 (BP Location: Right Arm)   Pulse 81   Temp 99.1 F (37.3 C) (Oral)   Resp 19   Ht 5' 3 (1.6 m)   Wt 63.5 kg   SpO2 98%   BMI 24.80 kg/m   Physical Exam Vitals and nursing note  reviewed.  Constitutional:      General: She is not in acute distress.    Appearance: Normal appearance.  HENT:     Head: Normocephalic and atraumatic.  Eyes:     General:        Right eye: No discharge.        Left eye: No discharge.  Cardiovascular:     Rate and Rhythm: Normal rate and regular rhythm.     Heart sounds: No murmur heard.    No friction rub. No gallop.  Pulmonary:     Effort: Pulmonary effort is normal.     Breath sounds: Normal breath sounds.  Abdominal:     General: Bowel sounds are normal.     Palpations: Abdomen is soft.     Comments: Left CVA tenderness, suprapubic tenderness, no rebound, rigidity, guarding throughout.  Skin:     General: Skin is warm and dry.     Capillary Refill: Capillary refill takes less than 2 seconds.  Neurological:     Mental Status: She is alert and oriented to person, place, and time.  Psychiatric:        Mood and Affect: Mood normal.        Behavior: Behavior normal.     (all labs ordered are listed, but only abnormal results are displayed) Labs Reviewed  URINALYSIS, ROUTINE W REFLEX MICROSCOPIC - Abnormal; Notable for the following components:      Result Value   Color, Urine ORANGE (*)    Specific Gravity, Urine 1.004 (*)    Hgb urine dipstick SMALL (*)    Nitrite POSITIVE (*)    All other components within normal limits  LIPASE, BLOOD - Abnormal; Notable for the following components:   Lipase 63 (*)    All other components within normal limits  URINE CULTURE  CBC WITH DIFFERENTIAL/PLATELET  COMPREHENSIVE METABOLIC PANEL WITH GFR    EKG: None  Radiology: CT Renal Stone Study Result Date: 06/22/2024 EXAM: CT ABDOMEN AND PELVIS WITHOUT CONTRAST 06/22/2024 07:03:05 PM TECHNIQUE: CT of the abdomen and pelvis was performed without the administration of intravenous contrast. Multiplanar reformatted images are provided for review. Automated exposure control, iterative reconstruction, and/or weight-based adjustment of the mA/kV was utilized to reduce the radiation dose to as low as reasonably achievable. COMPARISON: 05/11/2024 CLINICAL HISTORY: Left lung pain, history of renal calculi. FINDINGS: LOWER CHEST: No acute abnormality. LIVER: The liver is unremarkable. GALLBLADDER AND BILE DUCTS: Cholecystectomy. No biliary ductal dilatation. SPLEEN: No acute abnormality. PANCREAS: No acute abnormality. ADRENAL GLANDS: No acute abnormality. KIDNEYS, URETERS AND BLADDER: Mild and improved left hydronephrosis and hydroureter extending down to a cluster of distal ureteral calculi. The largest calculus at the UVJ measures 0.7 cm in diameter on image 68 series 2, with a 5 mm calculus just proximal  to this, and punctate 2 to 3 mm calculi in the distal ureter further proximally. Previously, there was a single visible left ureteral stone measuring about 6 mm in diameter just past the iliac vessel crossover. 4 nonobstructive left renal calculi are present, the largest in the lower pole measuring 7 mm in diameter. 3 nonobstructive right kidney lower pole calculi are present, the largest 5 mm in diameter in the lower pole. Fluid density 1.2 cm left mid kidney lesion compatible with benign cyst. No further imaging workup of this lesion is indicated. Per consensus, no follow-up is needed for simple Bosniak type 1 and 2 renal cysts, unless the patient has a malignancy history or risk factors.  No perinephric or periureteral stranding. Urinary bladder is unremarkable. GI AND BOWEL: Stomach demonstrates no acute abnormality. Parenchymal colonic diverticulosis most concentrated in the descending and proximal sigmoid colon. Normal appendix. No current findings of active diverticulitis. There is no bowel obstruction. PERITONEUM AND RETROPERITONEUM: No ascites. No free air. VASCULATURE: Aorta is normal in caliber. LYMPH NODES: No lymphadenopathy. REPRODUCTIVE ORGANS: No acute abnormality. BONES AND SOFT TISSUES: Degenerative facet arthropathy at L5-S1 with grade 1 degenerative anterolisthesis. No focal soft tissue abnormality. IMPRESSION: 1. Mild and improved left hydronephrosis and hydroureter due to a cluster of distal left ureteral calculi including a 7 mm stone at the UVJ, with additional 5 mm and punctate 23 mm distal ureteral calculi, interval change from a prior single 6 mm left ureteral stone. 2. Multiple nonobstructive left renal calculi, largest 7 mm in the lower pole. 3. Multiple nonobstructive right renal calculi, largest 5 mm in the lower pole. 4. Parenchymal colonic diverticulosis, most pronounced in the descending and proximal sigmoid colon, without diverticulitis. 5. Degenerative facet arthropathy at L5-S1  with grade 1 degenerative anterolisthesis. Electronically signed by: Ryan Salvage MD 06/22/2024 08:08 PM EST RP Workstation: [id number]     Procedures   Medications Ordered in the ED  cephALEXin  (KEFLEX ) capsule 500 mg (has no administration in time range)  ketorolac  (TORADOL ) 15 MG/ML injection 15 mg (15 mg Intravenous Given 06/22/24 1848)  morphine  (PF) 4 MG/ML injection 4 mg (4 mg Intravenous Given 06/22/24 1848)                                     Medical Decision Making Amount and/or Complexity of Data Reviewed Labs: ordered.   This patient is a 53 y.o. female  who presents to the ED for concern of flank pain.   Differential diagnoses prior to evaluation: The emergent differential diagnosis includes, but is not limited to,  AAA, renal vascular thrombosis, mesenteric ischemia, pyelonephritis, nephrolithiasis, cystitis, biliary colic, pancreatitis, PUD, appendicitis, diverticulitis, bowel obstruction --overall suspect symptoms related to ongoing known left-sided kidney stone this is not an exhaustive differential.   Past Medical History / Co-morbidities / Social History: chronic pain, hypertension, recurrent kidney stones   Additional history: Chart reviewed. Pertinent results include: Reviewed lab work, imaging from previous emergency department visits  Physical Exam: Physical exam performed. The pertinent findings include: Left CVA tenderness, suprapubic tenderness, no rebound, rigidity, guarding throughout.  Vital signs stable  Lab Tests/Imaging studies: I personally interpreted labs/imaging and the pertinent results include: CBC unremarkable, CMP unremarkable, very mildly elevated lipase at 63, not 3 times upper limit of normal, very low clinical suspicion for developing pancreatitis.  Her UA has some nitrates but no bacteria, only 6-10 white blood cells, small hemoglobin noted.  Difficult to assess given her AZO usage, overall low clinical suspicion for true  infection, but given her stone I think reasonable to treat for possible infection, and we will send the urine for culture.  I dependently interpreted CT renal stone study which shows:  1. Mild and improved left hydronephrosis and hydroureter due to a cluster of  distal left ureteral calculi including a 7 mm stone at the UVJ, with additional  5 mm and punctate 23 mm distal ureteral calculi, interval change from a prior  single 6 mm left ureteral stone.  2. Multiple nonobstructive left renal calculi, largest 7 mm in the lower pole.  3. Multiple nonobstructive right renal calculi, largest 5  mm in the lower pole.  4. Parenchymal colonic diverticulosis, most pronounced in the descending and  proximal sigmoid colon, without diverticulitis.  5. Degenerative facet arthropathy at L5-S1 with grade 1 degenerative  anterolisthesis.  .I agree with the radiologist interpretation.    Medications: I ordered medication including morphine , Toradol  for pain, Keflex  to cover for possible urinary tract infection, discharged with short refill of her pain medication as well as cefadroxil , encourage close urology follow-up, patient understands and agrees to plan..  I have reviewed the patients home medicines and have made adjustments as needed.   Disposition: After consideration of the diagnostic results and the patients response to treatment, I feel that patient is stable for discharge with plan as above.   emergency department workup does not suggest an emergent condition requiring admission or immediate intervention beyond what has been performed at this time. The plan is: as above. The patient is safe for discharge and has been instructed to return immediately for worsening symptoms, change in symptoms or any other concerns.   Final diagnoses:  Kidney stone    ED Discharge Orders          Ordered    cefadroxil  (DURICEF) 500 MG capsule  2 times daily        06/22/24 2021    oxyCODONE -acetaminophen   (PERCOCET/ROXICET) 5-325 MG tablet  Every 6 hours PRN        06/22/24 2021               Angeliki Mates H, PA-C 06/22/24 2023    Melvenia Motto, MD 06/22/24 2252

## 2024-06-22 NOTE — ED Triage Notes (Signed)
 Pt arrived via POV c/o recurrent left flank pain and reports recent Dx of having a 6mm stone on 10/13. Pt reports she is discussing surgery options with her provider as well, but reports the stone feels like it is moving towards her groin and she is also concerned she has developed a UTI due to reporting her urine is cloudy and dysuria.

## 2024-06-23 ENCOUNTER — Other Ambulatory Visit: Payer: Self-pay

## 2024-06-23 DIAGNOSIS — N201 Calculus of ureter: Secondary | ICD-10-CM

## 2024-06-23 NOTE — Progress Notes (Unsigned)
 Surgical Physician Order Form [facility]  Dr. Penne Skye, MD  * Scheduling expectation : Dec 5th afternoon  *Length of Case: 60 min  *Clearance needed: no  *Anticoagulation Instructions: N/A  *Aspirin Instructions: N/A  *Post-op visit Date/Instructions:  1 month with RUS prior  *Diagnosis: Left Ureteral Stone  *Procedure: left Ureteroscopy w/laser lithotripsy & stent placement (47643)   Additional orders: N/A  -Admit type: OUTpatient  -Anesthesia: Choice  -VTE Prophylaxis Standing Order SCD's       Other:   -Standing Lab Orders Per Anesthesia    Lab other: UA/Ucx  -Standing Test orders EKG/Chest x-Osei per Anesthesia       Test other:   - Medications:  Ancef 2gm IV  -Other orders:  N/A

## 2024-06-23 NOTE — Telephone Encounter (Signed)
 Called pt and pt husband did not receive a answer from both of them. I left detailed VM. I called about her MyChart message pt stated her pain level was intolerable and she would like to push her surgery appointment to a earlier date. Dr. Georganne let me know that if she would like to discuss the robotic surgery we could schedule a office visit to to discuss before proceeding.-Telicia Hodgkiss,CMA.

## 2024-06-23 NOTE — Addendum Note (Signed)
 Addended by: GEORGANNE PENNE SAUNDERS on: 06/23/2024 07:26 AM   Modules accepted: Orders

## 2024-06-24 ENCOUNTER — Telehealth: Payer: Self-pay

## 2024-06-24 LAB — URINE CULTURE: Culture: NO GROWTH

## 2024-06-24 NOTE — Progress Notes (Signed)
   [facility] Surgical Posting Form  Surgery Date: Date: 06/30/2024  Surgeon: Dr. Penne Skye, MD  Inpt ( No  )   Outpt (Yes)   Obs ( No  )   Diagnosis: N20.1 Left Ureteral Stone  -CPT: [phone]  Surgery: Left Ureteroscopy with Laser Lithotripsy and Stent Placement   Stop Anticoagulations: Yes, hold ASA  Cardiac/Medical/Pulmonary Clearance needed: no  *Orders entered into EPIC  Date: 06/24/24   *Case booked in [place]  Date: 06/24/24  *Notified pt of Surgery: Date: 06/24/24  PRE-OP UA & CX: yes, obtained in clinic on 06/23/2024  *Placed into Prior Authorization Work [place] Date: 06/24/24  Assistant/laser/rep:No

## 2024-06-24 NOTE — Telephone Encounter (Signed)
 Called pt to schedule a office visit for her to answer any questions she had before her surgery on the 06/30/2024. Scheduled office visit at 10:45 on 06/29/2024. Pt had no further questions or comments.-Jordany Russett,CMA

## 2024-06-24 NOTE — Telephone Encounter (Signed)
 Per Dr. Georganne, Patient is to be scheduled for  Left Ureteroscopy with Laser Lithotripsy and Stent Placement   Morgan James was contacted and possible surgical dates were discussed, Tuesday December 2nd, 2025 was agreed upon for surgery.   Patient was directed to call [phone] between 1-3pm the day before surgery to find out surgical arrival time.  Instructions were given not to eat or drink from midnight on the night before surgery and have a driver for the day of surgery. On the surgery day patient was instructed to enter through the [facility] entrance of [facility] report the Same Day Surgery desk.   Pre-Admit Testing will be in contact via phone to set up an interview with the anesthesia team to review your history and medications prior to surgery.   Reminder of this information was sent via MyChart to the patient.

## 2024-06-29 ENCOUNTER — Ambulatory Visit (INDEPENDENT_AMBULATORY_CARE_PROVIDER_SITE_OTHER): Admitting: Urology

## 2024-06-29 ENCOUNTER — Encounter: Payer: Self-pay | Admitting: Urology

## 2024-06-29 ENCOUNTER — Encounter
Admission: RE | Admit: 2024-06-29 | Discharge: 2024-06-29 | Disposition: A | Source: Ambulatory Visit | Attending: Urology

## 2024-06-29 ENCOUNTER — Other Ambulatory Visit: Payer: Self-pay

## 2024-06-29 VITALS — BP 132/87 | HR 82 | Ht 62.0 in | Wt 138.0 lb

## 2024-06-29 DIAGNOSIS — N200 Calculus of kidney: Secondary | ICD-10-CM

## 2024-06-29 HISTORY — DX: Personal history of urinary calculi: Z87.442

## 2024-06-29 MED ORDER — ORAL CARE MOUTH RINSE: 15.0000 mL | Freq: Once | OROMUCOSAL | Status: AC

## 2024-06-29 MED ORDER — LACTATED RINGERS IV SOLN: INTRAVENOUS | Status: DC

## 2024-06-29 MED ORDER — CHLORHEXIDINE GLUCONATE 0.12 % MT SOLN
15.0000 mL | Freq: Once | OROMUCOSAL | Status: AC
  Administered 2024-06-30: 15 mL via OROMUCOSAL

## 2024-06-29 MED ORDER — CEFAZOLIN SODIUM-DEXTROSE 2-4 GM/100ML-% IV SOLN
2.0000 g | INTRAVENOUS | Status: AC
  Administered 2024-06-30: 2 g via INTRAVENOUS

## 2024-06-29 NOTE — H&P (View-Only) (Signed)
   06/29/2024 10:44 AM   Morgan James 1971-06-09 [id number]  Reason for visit: Follow up Left ureteral stones   HPI: 53 y.o. female, follow up with me today She had elected to delay her initial treatment due to loss of insurance Ongoing severe left abdominal pain- she had communicated several times via Mychart Offered next available URS date tomorrow, instead of ESWL   Prior HPI: Long history of nephrolithiasis ED Visit yesterday 05/11/24 - acute left flank pain CT Stone - 6 mm left mid ureteral stone with hydronephrosis Discharged on MET   Ongoing left flank pain, seen again in ED last night Today she is doing fairly well, not as bad although having ongoing pain She is taking only oxycodone  and Flomax , not taking Tylenol  or NSAIDs  She has a urine strainer No fevers chills, no nausea vomiting    - s/p Left URS/LL (Mar 2025)    - 24-hr urine (May 2024) -hypocitraturia, prescribed Kcit 15meq BID   - s/p Right ESWL (May 2024)       Physical Exam: BP 132/87   Pulse 82   Ht 5' 2 (1.575 m)   Wt 138 lb (62.6 kg)   BMI 25.24 kg/m    General: no acute distress, alert/oriented, conversational  HEENT: equal nondilated pupils CV: regular rate Lung: unlabored breathing, regular rate and rhythm  Abd: nondistended, nontender with palpation, no palpable masses  MSK: moving all extremities without issue, normal observed motor function   Laboratory Data: Ucx (06/22/24) - negative  Pertinent Imaging: I have personally viewed and interpreted the CT Stone 05/11/24 - multiple bilateral nonobstructive stones. 6mm mid to distal Left ureteral stone with proximal hydronephrosis. Also, has 6mm LLP stone, punctate left inter and upper pole calcifications. Right kidney has ~4-66mm RLP stones x2, no hydro. .    Assessment & Plan:    Nephrolithiasis Assessment & Plan: 6mm left mid-distal ureteral stone (dx 05/11/24)   - Also, 6mm LLP stone, punctate upper pole   - ~4-32mm RLP  stones  Ucx (06/22/24) - negative  Reviewed left ureteroscopy with laser lithotripsy procedure today, for which she is scheduled tomorrow. She is familiar with the procedure having undergone the same in March of this year. Risks/benefits/outcomes and possible complications reviewed, all questions answered.   - Proceed to OR tomorrow: Left URS/LL + stent placement        [name], MD  [facility] [address] [place], [place] [address] [phone]

## 2024-06-29 NOTE — Progress Notes (Signed)
   06/29/2024 10:44 AM   Morgan James 1971-06-09 [id number]  Reason for visit: Follow up Left ureteral stones   HPI: 53 y.o. female, follow up with me today She had elected to delay her initial treatment due to loss of insurance Ongoing severe left abdominal pain- she had communicated several times via Mychart Offered next available URS date tomorrow, instead of ESWL   Prior HPI: Long history of nephrolithiasis ED Visit yesterday 05/11/24 - acute left flank pain CT Stone - 6 mm left mid ureteral stone with hydronephrosis Discharged on MET   Ongoing left flank pain, seen again in ED last night Today she is doing fairly well, not as bad although having ongoing pain She is taking only oxycodone  and Flomax , not taking Tylenol  or NSAIDs  She has a urine strainer No fevers chills, no nausea vomiting    - s/p Left URS/LL (Mar 2025)    - 24-hr urine (May 2024) -hypocitraturia, prescribed Kcit 15meq BID   - s/p Right ESWL (May 2024)       Physical Exam: BP 132/87   Pulse 82   Ht 5' 2 (1.575 m)   Wt 138 lb (62.6 kg)   BMI 25.24 kg/m    General: no acute distress, alert/oriented, conversational  HEENT: equal nondilated pupils CV: regular rate Lung: unlabored breathing, regular rate and rhythm  Abd: nondistended, nontender with palpation, no palpable masses  MSK: moving all extremities without issue, normal observed motor function   Laboratory Data: Ucx (06/22/24) - negative  Pertinent Imaging: I have personally viewed and interpreted the CT Stone 05/11/24 - multiple bilateral nonobstructive stones. 6mm mid to distal Left ureteral stone with proximal hydronephrosis. Also, has 6mm LLP stone, punctate left inter and upper pole calcifications. Right kidney has ~4-66mm RLP stones x2, no hydro. .    Assessment & Plan:    Nephrolithiasis Assessment & Plan: 6mm left mid-distal ureteral stone (dx 05/11/24)   - Also, 6mm LLP stone, punctate upper pole   - ~4-32mm RLP  stones  Ucx (06/22/24) - negative  Reviewed left ureteroscopy with laser lithotripsy procedure today, for which she is scheduled tomorrow. She is familiar with the procedure having undergone the same in March of this year. Risks/benefits/outcomes and possible complications reviewed, all questions answered.   - Proceed to OR tomorrow: Left URS/LL + stent placement        Morgan JONELLE Skye, MD  [facility] [address] [place], [place] [address] [phone]

## 2024-06-29 NOTE — Assessment & Plan Note (Addendum)
 6mm left mid-distal ureteral stone (dx 05/11/24)   - Also, 6mm LLP stone, punctate upper pole   - ~4-29mm RLP stones  Ucx (06/22/24) - negative  Reviewed left ureteroscopy with laser lithotripsy procedure today, for which she is scheduled tomorrow. She is familiar with the procedure having undergone the same in March of this year. Risks/benefits/outcomes and possible complications reviewed, all questions answered.   - Proceed to OR tomorrow: Left URS/LL + stent placement

## 2024-06-29 NOTE — Patient Instructions (Addendum)
 Your procedure is scheduled on: Tuesday 06/30/24  Report to the Registration Desk on the 1st floor of the [facility]. To find out your arrival time, please call [phone] between 1PM - 3PM on: Monday 06/29/24 If your arrival time is 6:00 am, do not arrive before that time as the [facility] entrance doors do not open until 6:00 am.  REMEMBER: Instructions that are not followed completely may result in serious medical risk, up to and including death; or upon the discretion of your surgeon and anesthesiologist your surgery may need to be rescheduled.  Do not eat food or drink fluids after midnight the night before surgery.  No gum chewing or hard candies.   One week prior to surgery: Stop Anti-inflammatories (NSAIDS) such as Advil , Aleve , Ibuprofen , Motrin , Naproxen , Naprosyn  and Aspirin based products such as Excedrin, Goody's Powder, BC Powder. Stop ANY OVER THE COUNTER supplements until after surgery.  You may however, continue to take Tylenol  if needed for pain up until the day of surgery.  Continue taking all of your other prescription medications up until the day of surgery.  ON THE DAY OF SURGERY ONLY TAKE THESE MEDICATIONS WITH SIPS OF WATER:  cefadroxil  (DURICEF) 500 MG  tamsulosin  (FLOMAX ) 0.4 MG   No Alcohol for 24 hours before or after surgery.  No Smoking including e-cigarettes for 24 hours before surgery.  No chewable tobacco products for at least 6 hours before surgery.  No nicotine patches on the day of surgery.  Do not use any recreational drugs for at least a week (preferably 2 weeks) before your surgery.  Please be advised that the combination of cocaine and anesthesia may have negative outcomes, up to and including death. If you test positive for cocaine, your surgery will be cancelled.  On the morning of surgery brush your teeth with toothpaste and water, you may rinse your mouth with mouthwash if you wish. Do not swallow any toothpaste or  mouthwash.  Take a fresh shower the morning of your procedure.  Do not wear jewelry, make-up, hairpins, clips or nail polish.  For welded (permanent) jewelry: bracelets, anklets, waist bands, etc.  Please have this removed prior to surgery.  If it is not removed, there is a chance that hospital personnel will need to cut it off on the day of surgery.  Do not wear lotions, powders, or perfumes.   Do not shave body hair from the neck down 48 hours before surgery.  Contact lenses, hearing aids and dentures may not be worn into surgery.  Do not bring valuables to the hospital. [facility] is not responsible for any missing/lost belongings or valuables.   Notify your doctor if there is any change in your medical condition (cold, fever, infection).  Wear comfortable clothing (specific to your surgery type) to the hospital.  After surgery, you can help prevent lung complications by doing breathing exercises.  Take deep breaths and cough every 1-2 hours. Your doctor may order a device called an Incentive Spirometer to help you take deep breaths. When coughing or sneezing, hold a pillow firmly against your incision with both hands. This is called "splinting." Doing this helps protect your incision. It also decreases belly discomfort.  If you are being admitted to the hospital overnight, leave your suitcase in the car. After surgery it may be brought to your room.  In case of increased patient census, it may be necessary for you, the patient, to continue your postoperative care in the Same Day Surgery  department.  If you are being discharged the day of surgery, you will not be allowed to drive home. You will need a responsible individual to drive you home and stay with you for 24 hours after surgery.   If you are taking public transportation, you will need to have a responsible individual with you.  Please call the Pre-admissions Testing Dept. at [phone] if you have any questions about  these instructions.  Surgery Visitation Policy:  Patients having surgery or a procedure may have two visitors.  Children under the age of 90 must have an adult with them who is not the patient.  Inpatient Visitation:    Visiting hours are 7 a.m. to 8 p.m. Up to four visitors are allowed at one time in a patient room. The visitors may rotate out with other people during the day.  One visitor age 16 or older may stay with the patient overnight and must be in the room by 8 p.m.   Merchandiser, Retail to address health-related social needs:  [email or web address]

## 2024-06-30 ENCOUNTER — Ambulatory Visit: Payer: Self-pay | Admitting: Urgent Care

## 2024-06-30 ENCOUNTER — Ambulatory Visit

## 2024-06-30 ENCOUNTER — Ambulatory Visit: Payer: Self-pay | Admitting: Certified Registered"

## 2024-06-30 ENCOUNTER — Other Ambulatory Visit: Payer: Self-pay

## 2024-06-30 ENCOUNTER — Encounter
Admission: RE | Disposition: A | Discharge status: Home / Self Care | Payer: Self-pay | Source: Home / Self Care | Attending: Urology

## 2024-06-30 ENCOUNTER — Ambulatory Visit
Admission: RE | Admit: 2024-06-30 | Discharge: 2024-06-30 | Disposition: A | Payer: Self-pay | Attending: Urology | Admitting: Urology

## 2024-06-30 ENCOUNTER — Encounter: Payer: Self-pay | Admitting: Urology

## 2024-06-30 DIAGNOSIS — N202 Calculus of kidney with calculus of ureter: Secondary | ICD-10-CM

## 2024-06-30 DIAGNOSIS — N201 Calculus of ureter: Secondary | ICD-10-CM

## 2024-06-30 HISTORY — PX: CYSTOSCOPY/URETEROSCOPY/HOLMIUM LASER/STENT PLACEMENT: SHX6546

## 2024-06-30 SURGERY — CYSTOSCOPY/URETEROSCOPY/HOLMIUM LASER/STENT PLACEMENT
Anesthesia: General | Site: Ureter | Laterality: Left

## 2024-06-30 MED ORDER — CEFAZOLIN SODIUM-DEXTROSE 2-4 GM/100ML-% IV SOLN
INTRAVENOUS | Status: AC
  Filled 2024-06-30: qty 100

## 2024-06-30 MED ORDER — LIDOCAINE HCL (PF) 2 % IJ SOLN
INTRAMUSCULAR | Status: AC
  Filled 2024-06-30: qty 5

## 2024-06-30 MED ORDER — SUGAMMADEX SODIUM 200 MG/2ML IV SOLN
INTRAVENOUS | Status: DC | PRN
  Administered 2024-06-30: 200 mg via INTRAVENOUS

## 2024-06-30 MED ORDER — FENTANYL CITRATE (PF) 100 MCG/2ML IJ SOLN
INTRAMUSCULAR | Status: AC
  Filled 2024-06-30: qty 2

## 2024-06-30 MED ORDER — PHENYLEPHRINE 80 MCG/ML (10ML) SYRINGE FOR IV PUSH (FOR BLOOD PRESSURE SUPPORT)
PREFILLED_SYRINGE | INTRAVENOUS | Status: AC
  Filled 2024-06-30: qty 10

## 2024-06-30 MED ORDER — FENTANYL CITRATE (PF) 100 MCG/2ML IJ SOLN
INTRAMUSCULAR | Status: DC | PRN
  Administered 2024-06-30 (×2): 25 ug via INTRAVENOUS
  Administered 2024-06-30: 50 ug via INTRAVENOUS

## 2024-06-30 MED ORDER — TAMSULOSIN HCL 0.4 MG PO CAPS
0.4000 mg | ORAL_CAPSULE | Freq: Every day | ORAL | Status: AC
  Administered 2024-06-30: 0.4 mg via ORAL

## 2024-06-30 MED ORDER — SUCCINYLCHOLINE CHLORIDE 200 MG/10ML IV SOSY
PREFILLED_SYRINGE | INTRAVENOUS | Status: AC
  Filled 2024-06-30: qty 10

## 2024-06-30 MED ORDER — ROCURONIUM BROMIDE 100 MG/10ML IV SOLN
INTRAVENOUS | Status: DC | PRN
  Administered 2024-06-30: 10 mg via INTRAVENOUS
  Administered 2024-06-30: 30 mg via INTRAVENOUS

## 2024-06-30 MED ORDER — PHENAZOPYRIDINE HCL 200 MG PO TABS: 200.0000 mg | ORAL_TABLET | Freq: Three times a day (TID) | ORAL | 0 refills | Status: AC | PRN

## 2024-06-30 MED ORDER — CIPROFLOXACIN HCL 500 MG PO TABS: ORAL_TABLET | ORAL | 0 refills | Status: AC

## 2024-06-30 MED ORDER — PHENYLEPHRINE 80 MCG/ML (10ML) SYRINGE FOR IV PUSH (FOR BLOOD PRESSURE SUPPORT)
PREFILLED_SYRINGE | INTRAVENOUS | Status: DC | PRN
  Administered 2024-06-30: 120 ug via INTRAVENOUS
  Administered 2024-06-30 (×2): 80 ug via INTRAVENOUS

## 2024-06-30 MED ORDER — DEXAMETHASONE SOD PHOSPHATE PF 10 MG/ML IJ SOLN
INTRAMUSCULAR | Status: DC | PRN
  Administered 2024-06-30: 5 mg via INTRAVENOUS

## 2024-06-30 MED ORDER — EPHEDRINE SULFATE (PRESSORS) 25 MG/5ML IV SOSY
PREFILLED_SYRINGE | INTRAVENOUS | Status: DC | PRN
  Administered 2024-06-30: 5 mg via INTRAVENOUS

## 2024-06-30 MED ORDER — OXYBUTYNIN CHLORIDE ER 5 MG PO TB24
ORAL_TABLET | ORAL | Status: AC
  Filled 2024-06-30: qty 1

## 2024-06-30 MED ORDER — TRAMADOL HCL 50 MG PO TABS: 50.0000 mg | ORAL_TABLET | Freq: Four times a day (QID) | ORAL | 0 refills | Status: AC | PRN

## 2024-06-30 MED ORDER — OXYCODONE HCL 5 MG PO TABS
ORAL_TABLET | ORAL | Status: AC
  Filled 2024-06-30: qty 1

## 2024-06-30 MED ORDER — IOHEXOL 180 MG/ML  SOLN
INTRAMUSCULAR | Status: DC | PRN
  Administered 2024-06-30 (×2): 10 mL

## 2024-06-30 MED ORDER — LIDOCAINE HCL (CARDIAC) PF 100 MG/5ML IV SOSY
PREFILLED_SYRINGE | INTRAVENOUS | Status: DC | PRN
  Administered 2024-06-30: 60 mg via INTRAVENOUS

## 2024-06-30 MED ORDER — SUCCINYLCHOLINE CHLORIDE 200 MG/10ML IV SOSY
PREFILLED_SYRINGE | INTRAVENOUS | Status: DC | PRN
  Administered 2024-06-30: 80 mg via INTRAVENOUS

## 2024-06-30 MED ORDER — CHLORHEXIDINE GLUCONATE 0.12 % MT SOLN
OROMUCOSAL | Status: AC
  Filled 2024-06-30: qty 15

## 2024-06-30 MED ORDER — SODIUM CHLORIDE 0.9 % IR SOLN
Status: DC | PRN
  Administered 2024-06-30: 3000 mL

## 2024-06-30 MED ORDER — STERILE WATER FOR IRRIGATION IR SOLN
Status: DC | PRN
  Administered 2024-06-30: 500 mL

## 2024-06-30 MED ORDER — TAMSULOSIN HCL 0.4 MG PO CAPS: 0.4000 mg | ORAL_CAPSULE | Freq: Every day | ORAL | 0 refills | Status: AC

## 2024-06-30 MED ORDER — OXYBUTYNIN CHLORIDE ER 5 MG PO TB24: 5.0000 mg | ORAL_TABLET | Freq: Every day | ORAL | 0 refills | Status: AC

## 2024-06-30 MED ORDER — EPHEDRINE 5 MG/ML INJ
INTRAVENOUS | Status: AC
  Filled 2024-06-30: qty 5

## 2024-06-30 MED ORDER — ONDANSETRON HCL 4 MG/2ML IJ SOLN
INTRAMUSCULAR | Status: AC
  Filled 2024-06-30: qty 2

## 2024-06-30 MED ORDER — MIDAZOLAM HCL (PF) 2 MG/2ML IJ SOLN
INTRAMUSCULAR | Status: DC | PRN
  Administered 2024-06-30: 2 mg via INTRAVENOUS

## 2024-06-30 MED ORDER — ROCURONIUM BROMIDE 10 MG/ML (PF) SYRINGE
PREFILLED_SYRINGE | INTRAVENOUS | Status: AC
  Filled 2024-06-30: qty 10

## 2024-06-30 MED ORDER — OXYCODONE HCL 5 MG PO TABS
5.0000 mg | ORAL_TABLET | Freq: Once | ORAL | Status: AC | PRN
  Administered 2024-06-30: 5 mg via ORAL

## 2024-06-30 MED ORDER — MIDAZOLAM HCL 2 MG/2ML IJ SOLN
INTRAMUSCULAR | Status: AC
  Filled 2024-06-30: qty 2

## 2024-06-30 MED ORDER — TAMSULOSIN HCL 0.4 MG PO CAPS
ORAL_CAPSULE | ORAL | Status: AC
  Filled 2024-06-30: qty 1

## 2024-06-30 MED ORDER — OXYCODONE HCL 5 MG/5ML PO SOLN: 5.0000 mg | Freq: Once | ORAL | Status: AC | PRN

## 2024-06-30 MED ORDER — ONDANSETRON HCL 4 MG/2ML IJ SOLN
INTRAMUSCULAR | Status: DC | PRN
  Administered 2024-06-30: 4 mg via INTRAVENOUS

## 2024-06-30 MED ORDER — OXYBUTYNIN CHLORIDE ER 5 MG PO TB24
5.0000 mg | ORAL_TABLET | Freq: Every day | ORAL | Status: DC
  Administered 2024-06-30: 5 mg via ORAL
  Filled 2024-06-30: qty 1

## 2024-06-30 MED ORDER — FENTANYL CITRATE (PF) 100 MCG/2ML IJ SOLN
25.0000 ug | INTRAMUSCULAR | Status: DC | PRN
  Administered 2024-06-30 (×2): 50 ug via INTRAVENOUS

## 2024-06-30 MED ORDER — PROPOFOL 10 MG/ML IV BOLUS
INTRAVENOUS | Status: DC | PRN
  Administered 2024-06-30: 130 mg via INTRAVENOUS

## 2024-06-30 SURGICAL SUPPLY — 25 items
ADHESIVE MASTISOL STRL (MISCELLANEOUS) IMPLANT
BAG DRAIN SIEMENS DORNER NS (MISCELLANEOUS) ×1 IMPLANT
BAG PRESSURE INF REUSE 3000 (BAG) ×1 IMPLANT
BASKET ZERO TIP 1.9FR (BASKET) IMPLANT
BRUSH SCRUB EZ 4% CHG (MISCELLANEOUS) ×1 IMPLANT
CATH URET FLEX-TIP 2 LUMEN 10F (CATHETERS) IMPLANT
CATH URETL OPEN 5X70 (CATHETERS) IMPLANT
DRAPE UTILITY 15X26 TOWEL STRL (DRAPES) ×1 IMPLANT
DRSG TEGADERM 2-3/8X2-3/4 SM (GAUZE/BANDAGES/DRESSINGS) IMPLANT
FIBER LASER MOSES 200 DFL (Laser) IMPLANT
GLOVE BIOGEL PI IND STRL 7.0 (GLOVE) ×1 IMPLANT
GOWN STRL REUS W/ TWL LRG LVL3 (GOWN DISPOSABLE) ×2 IMPLANT
GUIDEWIRE STR DUAL SENSOR (WIRE) ×1 IMPLANT
KIT TURNOVER CYSTO (KITS) ×1 IMPLANT
PACK CYSTO AR (MISCELLANEOUS) ×1 IMPLANT
SET CYSTO IRRIGATION (SET/KITS/TRAYS/PACK) ×1 IMPLANT
SHEATH NAVIGATOR HD 12/14X36 (SHEATH) IMPLANT
SOL .9 NS 3000ML IRR UROMATIC (IV SOLUTION) ×1 IMPLANT
SOLN STERILE WATER 500 ML (IV SOLUTION) ×1 IMPLANT
STENT URET 6FRX24 CONTOUR (STENTS) IMPLANT
STENT URET 6FRX26 CONTOUR (STENTS) IMPLANT
SURGILUBE 2OZ TUBE FLIPTOP (MISCELLANEOUS) ×1 IMPLANT
SYR 10ML LL (SYRINGE) ×1 IMPLANT
TUBING THERMACLEAR UROLOGY (TUBING) IMPLANT
VALVE UROSEAL ADJ ENDO (VALVE) IMPLANT

## 2024-06-30 NOTE — Op Note (Signed)
 Preoperative diagnosis:  Left nephrolithiasis Left ureteral stone   Postoperative diagnosis:  Left nephrolithiasis Left ureteral stone    Procedure:  Cystoscopy Left ureteroscopy with laser lithotripsy Left retrograde pyelography with interpretation  Left ureteral stent placement  Surgeon: Penne Skye, MD  Anesthesia: General  Complications: None  Intraoperative findings: Large left distal ureteral stone with moderate impaction. Fully treated with laser lithotripsy and all fragments basket extracted Medium to larger LLP renal stone, as well as small interpolar stone- fully dusted with laser Temporary Left ureteral stent with string placed  EBL: Minimal  Specimens:  Left distal ureteral stone  Indication: Morgan James is a 53 y.o. patient with:  6mm left mid-distal ureteral stone (dx 05/11/24)   - Also, 6mm LLP stone, punctate upper pole   After reviewing the management options for treatment, he elected to proceed with the above surgical procedure(s). We have discussed the potential benefits and risks of the procedure, side effects of the proposed treatment, the likelihood of the patient achieving the goals of the procedure, and any potential problems that might occur during the procedure or recuperation. Informed consent has been obtained.  Description of procedure:  The patient was taken to the operating room and general anesthesia was induced.  The patient was placed in the dorsal lithotomy position, prepped and draped in the usual sterile fashion, and preoperative antibiotics were administered. A preoperative time-out was performed.   Cystourethroscopy was performed.  The patient's urethra was examined and was normal. The bladder was then systematically examined in its entirety. There was no evidence for any bladder tumors, stones, or other mucosal pathology.  Bilateral ureteral orifices noted in orthotopic location.  Attention then turned to the Left ureteral orifice  and a ureteral catheter was used to intubate the ureteral orifice.  Omnipaque  contrast was injected through the ureteral catheter and a retrograde pyelogram was performed with findings: moderate left proximal hydroureter with moderate to severe left hydronephrosis. Obvious left lower pole radiopacity  A sensor guidewire was then advanced up the Left ureter into the renal pelvis under fluoroscopic guidance. We transitioned to a semi-rigid ureteroscope and a complete pyeloscopy was performed.  Stone burden was encountered at the distal ureter consistent with preoperative imaging.  A 200 m holmium laser was used to fully treat the stone with laser lithotripsy.   Next, a zero-tip wire basket was introduced and all sizeable stone fragments were extracted.  Next we transition to a flexible ureteroscope which was passed alongside our wire and navigated into the proximal ureter.  We continued and performed a complete pyeloscopy which noted a large left lower pole stone as well as several smaller interpolar stones.  All stones were fully treated with laser lithotripsy in a dusting fashion.  There were no sizable fragments remaining.  We then performed a pullback retrograde pyelogram with direct visualization of the ureter.  There was no contrast extravasation and ureter appeared healthy.  We elected to place a temporary indwelling stent with a string. Our sensor guidewire was replaced and a 12F x 24 cm JJ stent , which was placed via fluoroscopic guidance. A spot KUB confirmed appropriate proximal and distal curl locations.   The bladder was then emptied and the procedure ended.  The patient appeared to tolerate the procedure well and without complications.  The patient was able to be awakened and transferred to the recovery unit in satisfactory condition.   Plan: - remove stent at home in 3 days and - follow up in clinic  in 4-6 weeks with PA, RBUS prior to visit  Penne Skye, M.D.

## 2024-06-30 NOTE — Transfer of Care (Signed)
 Immediate Anesthesia Transfer of Care Note  Patient: Morgan James  Procedure(s) Performed: CYSTOSCOPY/URETEROSCOPY/HOLMIUM LASER/STENT PLACEMENT (Left: Ureter)  Patient Location: PACU  Anesthesia Type:General  Level of Consciousness: awake, alert , and oriented  Airway & Oxygen Therapy: Patient Spontanous Breathing and Patient connected to face mask oxygen  Post-op Assessment: Report given to RN and Post -op Vital signs reviewed and stable  Post vital signs: Reviewed and stable  Last Vitals:  Vitals Value Taken Time  BP 127/80 06/30/24 12:00  Temp    Pulse 89 06/30/24 12:02  Resp 24 06/30/24 12:02  SpO2 100 % 06/30/24 12:02  Vitals shown include unfiled device data.  Last Pain:  Vitals:   06/30/24 0934  TempSrc: Temporal  PainSc: 0-No pain         Complications: No notable events documented.

## 2024-06-30 NOTE — Anesthesia Procedure Notes (Signed)
 Procedure Name: Intubation Date/Time: 06/30/2024 11:04 AM  Performed by: Duwayne Craven, CRNAPre-anesthesia Checklist: Patient identified, Patient being monitored, Timeout performed, Emergency Drugs available and Suction available Patient Re-evaluated:Patient Re-evaluated prior to induction Oxygen Delivery Method: Circle system utilized Preoxygenation: Pre-oxygenation with 100% oxygen Induction Type: IV induction Ventilation: Mask ventilation without difficulty Laryngoscope Size: Glidescope and 3 Grade View: Grade I Tube size: 7.5 mm Placement Confirmation: ETT inserted through vocal cords under direct vision, positive ETCO2 and breath sounds checked- equal and bilateral Secured at: 19 cm Tube secured with: Tape Dental Injury: Teeth and Oropharynx as per pre-operative assessment

## 2024-06-30 NOTE — Anesthesia Preprocedure Evaluation (Signed)
 Anesthesia Evaluation  Patient identified by MRN, date of birth, ID band Patient awake    Reviewed: Allergy & Precautions, NPO status , Patient's Chart, lab work & pertinent test results  Airway Mallampati: II  TM Distance: >3 FB Neck ROM: Full    Dental  (+) Dental Advisory Given   Pulmonary neg pulmonary ROS, Patient abstained from smoking., former smoker   Pulmonary exam normal breath sounds clear to auscultation       Cardiovascular hypertension, On Medications Normal cardiovascular exam Rhythm:Regular Rate:Normal     Neuro/Psych  Headaches PSYCHIATRIC DISORDERS Anxiety Depression     Neuromuscular disease    GI/Hepatic negative GI ROS, Neg liver ROS, hiatal hernia,,,  Endo/Other  negative endocrine ROS    Renal/GU      Musculoskeletal   Abdominal   Peds  Hematology negative hematology ROS (+)   Anesthesia Other Findings Past Medical History: No date: AC (acromioclavicular) joint bone spurs No date: Fibromyalgia No date: History of hiatal hernia No date: History of kidney stones No date: IBS (irritable bowel syndrome) No date: Kidney stones No date: Osteoarthritis No date: Ovarian cyst No date: Raynaud's disease No date: Rheumatoid arthritis (HCC) No date: Sjogren's disease No date: Varicose vein of leg  Past Surgical History: No date: c sections     Comment:  x2 No date: CHOLECYSTECTOMY 10/01/2023: CYSTOSCOPY WITH RETROGRADE PYELOGRAM, URETEROSCOPY AND  STENT PLACEMENT; Left     Comment:  Procedure: CYSTOSCOPY WITH RETROGRADE PYELOGRAM,               URETEROSCOPY AND STENT PLACEMENT;  Surgeon: Shona Layman BROCKS, MD;  Location: [facility];  Service: Urology;                Laterality: Left; No date: ENDOMETRIAL ABLATION 12/10/2022: EXTRACORPOREAL SHOCK WAVE LITHOTRIPSY; Right     Comment:  Procedure: EXTRACORPOREAL SHOCK WAVE LITHOTRIPSY (ESWL);              Surgeon: Shona Layman BROCKS, MD;   Location: [facility];  Service: Urology;  Laterality: Right; No date: LITHOTRIPSY     Reproductive/Obstetrics negative OB ROS                              Anesthesia Physical Anesthesia Plan  ASA: 2  Anesthesia Plan: General   Post-op Pain Management:    Induction: Intravenous  PONV Risk Score and Plan: 3 and Ondansetron , Dexamethasone  and Midazolam   Airway Management Planned: LMA  Additional Equipment:   Intra-op Plan:   Post-operative Plan: Extubation in OR  Informed Consent: I have reviewed the patients History and Physical, chart, labs and discussed the procedure including the risks, benefits and alternatives for the proposed anesthesia with the patient or authorized representative who has indicated his/her understanding and acceptance.     Dental Advisory Given  Plan Discussed with: Anesthesiologist, CRNA and Surgeon  Anesthesia Plan Comments: (Patient consented for risks of anesthesia including but not limited to:  - adverse reactions to medications - damage to eyes, teeth, lips or other oral mucosa - nerve damage due to positioning  - sore throat or hoarseness - Damage to heart, brain, nerves, lungs, other parts of body or loss of life  Patient voiced understanding and assent.)  Anesthesia Quick Evaluation

## 2024-06-30 NOTE — Interval H&P Note (Signed)
 History and Physical Interval Note:  06/30/2024 9:35 AM  [name]  has presented today for surgery, with the diagnosis of Left Ureteral Stone.  The various methods of treatment have been discussed with the patient and family. After consideration of risks, benefits and other options for treatment, the patient has consented to  Procedure(s): CYSTOSCOPY/URETEROSCOPY/HOLMIUM LASER/STENT PLACEMENT (Left) as a surgical intervention.  The patient's history has been reviewed, patient examined, no change in status, stable for surgery.  I have reviewed the patient's chart and labs.  Questions were answered to the patient's satisfaction.    General: no acute distress, alert/oriented, conversational  HEENT: equal nondilated pupils CV: regular rate Lung: unlabored breathing, regular rate and rhythm  Abd: nondistended, nontender with palpation, no palpable masses  MSK: moving all extremities without issue, normal observed motor function    Morgan James

## 2024-06-30 NOTE — Anesthesia Postprocedure Evaluation (Signed)
 Anesthesia Post Note  Patient: Camylle A Gaul  Procedure(s) Performed: CYSTOSCOPY/URETEROSCOPY/HOLMIUM LASER/STENT PLACEMENT (Left: Ureter)  Patient location during evaluation: PACU Anesthesia Type: General Level of consciousness: awake and alert Pain management: pain level controlled Vital Signs Assessment: post-procedure vital signs reviewed and stable Respiratory status: spontaneous breathing, nonlabored ventilation, respiratory function stable and patient connected to nasal cannula oxygen Cardiovascular status: blood pressure returned to baseline and stable Postop Assessment: no apparent nausea or vomiting Anesthetic complications: no   No notable events documented.   Last Vitals:  Vitals:   06/30/24 1230 06/30/24 1258  BP: 127/61 (!) 126/91  Pulse: 98 95  Resp: 18 16  Temp: 36.7 C 36.7 C  SpO2: 96% 100%    Last Pain:  Vitals:   06/30/24 1258  TempSrc: Temporal  PainSc: 0-No pain                 Debby Mines

## 2024-06-30 NOTE — Discharge Instructions (Addendum)
 DISCHARGE INSTRUCTIONS FOR KIDNEY STONE/URETERAL STENT   MEDICATIONS:  1.  Resume all your other meds from home - except do not take any extra narcotic pain meds that you may have at home.  2. Pyridium  is to help with the burning/stinging when you urinate. 3. Tramadol  is for moderate/severe pain, otherwise taking upto 1000 mg every 6 hours of plainTylenol will help treat your pain.   4. Take Cipro one hour prior to removal of your stent.   ACTIVITY:  1. No strenuous activity x 1week  2. No driving while on narcotic pain medications  3. Drink plenty of water  4. Continue to walk at home - you can still get blood clots when you are at home, so keep active, but don't over do it.  5. May return to work/school tomorrow or when you feel ready   BATHING:  1. You can shower and we recommend daily showers  2. You have a string coming from your urethra: The stent string is attached to your ureteral stent. Do not pull on this.   SIGNS/SYMPTOMS TO CALL:  Please call us  if you have a fever greater than 101.5, uncontrolled nausea/vomiting, uncontrolled pain, dizziness, unable to urinate, bloody urine, chest pain, shortness of breath, leg swelling, leg pain, redness around wound, drainage from wound, or any other concerns or questions.   You can reach us  at [phone].   FOLLOW-UP:  1. You will have an appointment in 6 weeks with a ultrasound of your kidneys prior.   2. You have a string attached to your stent, you may remove it on Friday 07/03/24. To do this, pull the strings until the stents are completely removed. You may feel an odd sensation in your back.

## 2024-07-01 ENCOUNTER — Encounter: Payer: Self-pay | Admitting: Urology

## 2024-07-01 ENCOUNTER — Other Ambulatory Visit: Payer: Self-pay

## 2024-07-01 DIAGNOSIS — N200 Calculus of kidney: Secondary | ICD-10-CM

## 2024-07-02 ENCOUNTER — Telehealth: Payer: Self-pay

## 2024-07-02 ENCOUNTER — Telehealth: Payer: Self-pay | Admitting: *Deleted

## 2024-07-02 NOTE — Telephone Encounter (Signed)
 LMTRC

## 2024-07-02 NOTE — Telephone Encounter (Signed)
 Patient called return your call. . I advised that you will reach out back to her soon. Maybe tomorrow if not next  week. She understands.

## 2024-07-04 ENCOUNTER — Other Ambulatory Visit: Payer: Self-pay | Admitting: Urology

## 2024-07-09 LAB — STONE ANALYSIS
Calcium Oxalate Dihydrate: 20 %
Calcium Oxalate Monohydrate: 80 %
Weight Calculi: 3 mg

## 2024-07-09 NOTE — Telephone Encounter (Signed)
 Attempted to contacted pt. N/a.   VM Full.

## 2024-07-20 NOTE — Telephone Encounter (Signed)
 See previous telephone encounter.

## 2024-07-24 NOTE — Progress Notes (Deleted)
" ° °  07/31/2024 8:49 AM   Morgan James 09-18-1970 [id number]  Reason for visit: Follow up Left ureteral stones   HPI: 53 y.o. female, follow up with me today  - s/p Left URS/LL (Dec 2025)   RBUS ***  Prior HPI: Long history of nephrolithiasis ED Visit yesterday 05/11/24 - acute left flank pain CT Stone - 6 mm left mid ureteral stone with hydronephrosis     - s/p Left URS/LL (Mar 2025)    - 24-hr urine (May 2024) -hypocitraturia, prescribed Kcit 15meq BID   - s/p Right ESWL (May 2024)   - s/p Left URS/LL (Dec 2025)      Physical Exam: There were no vitals taken for this visit.   General: no acute distress, alert/oriented, conversational  HEENT: equal nondilated pupils CV: regular rate Lung: unlabored breathing, regular rate and rhythm  Abd: nondistended, nontender with palpation, no palpable masses  MSK: moving all extremities without issue, normal observed motor function   Laboratory Data: Calcium Oxalate Monohydrate 80 VC  Calcium Oxalate Dihydrate 20 VC    Pertinent Imaging: I have personally viewed and interpreted the CT Stone 05/11/24 - multiple bilateral nonobstructive stones. 6mm mid to distal Left ureteral stone with proximal hydronephrosis. Also, has 6mm LLP stone, punctate left inter and upper pole calcifications. Right kidney has ~4-22mm RLP stones x2, no hydro. .    Assessment & Plan:    Nephrolithiasis Assessment & Plan: s/p Left URS/LL (Dec 2025)   - 6mm left mid-distal ureteral stone   - Also, 6mm LLP stone, punctate upper pole   Stone comp: CaOX mono 80/di 20   Today we reviewed the basic tenets of kidney stone management and prevention. We reviewed five broad recommendations:   - Adequate fluid intake >=2.5 L/day (goal urine output >=2 L/day). - Maintenance of normal dietary calcium (1,000-1,200 mg/day), avoid excess calcium supplements - Reduction of sodium intake (<2 g/day) to lower urinary calcium excretion. - Moderation of oxalate-rich  foods (nuts, spinach, beets, chocolate, tea)  - Limit animal protein (meat, fish, poultry); promote balanced diet with fruits/vegetables.          [name], MD  [facility] [address] [place], [place] [address] [phone] "

## 2024-07-24 NOTE — Assessment & Plan Note (Deleted)
 s/p Left URS/LL (Dec 2025)   - 6mm left mid-distal ureteral stone   - Also, 6mm LLP stone, punctate upper pole   Stone comp: CaOX mono 80/di 20   Today we reviewed the basic tenets of kidney stone management and prevention. We reviewed five broad recommendations:   - Adequate fluid intake >=2.5 L/day (goal urine output >=2 L/day). - Maintenance of normal dietary calcium (1,000-1,200 mg/day), avoid excess calcium supplements - Reduction of sodium intake (<2 g/day) to lower urinary calcium excretion. - Moderation of oxalate-rich foods (nuts, spinach, beets, chocolate, tea)  - Limit animal protein (meat, fish, poultry); promote balanced diet with fruits/vegetables.

## 2024-07-31 ENCOUNTER — Ambulatory Visit: Payer: Self-pay | Admitting: Urology

## 2024-07-31 DIAGNOSIS — N200 Calculus of kidney: Secondary | ICD-10-CM
